# Patient Record
Sex: Female | Born: 1945 | Race: Black or African American | Hispanic: No | Smoking: Smoker, current status unknown
Health system: Southern US, Community
[De-identification: ages and names within clinical notes are randomized; demographics above are authoritative.]

## PROBLEM LIST (undated history)

## (undated) DIAGNOSIS — J449 Chronic obstructive pulmonary disease, unspecified: Secondary | ICD-10-CM

## (undated) DIAGNOSIS — M069 Rheumatoid arthritis, unspecified: Secondary | ICD-10-CM

## (undated) DIAGNOSIS — E119 Type 2 diabetes mellitus without complications: Secondary | ICD-10-CM

## (undated) DIAGNOSIS — G4730 Sleep apnea, unspecified: Secondary | ICD-10-CM

## (undated) DIAGNOSIS — I2510 Atherosclerotic heart disease of native coronary artery without angina pectoris: Secondary | ICD-10-CM

## (undated) DIAGNOSIS — F419 Anxiety disorder, unspecified: Secondary | ICD-10-CM

## (undated) DIAGNOSIS — F329 Major depressive disorder, single episode, unspecified: Secondary | ICD-10-CM

## (undated) DIAGNOSIS — I10 Essential (primary) hypertension: Secondary | ICD-10-CM

## (undated) DIAGNOSIS — F32A Depression, unspecified: Secondary | ICD-10-CM

## (undated) DIAGNOSIS — E7800 Pure hypercholesterolemia, unspecified: Secondary | ICD-10-CM

## (undated) HISTORY — DX: Depression, unspecified: F32.A

## (undated) HISTORY — DX: Chronic obstructive pulmonary disease, unspecified: J44.9

## (undated) HISTORY — DX: Major depressive disorder, single episode, unspecified: F32.9

## (undated) HISTORY — DX: Atherosclerotic heart disease of native coronary artery without angina pectoris: I25.10

## (undated) HISTORY — DX: Anxiety disorder, unspecified: F41.9

## (undated) HISTORY — PX: ABLATION: SHX5711

---

## 1998-03-24 ENCOUNTER — Emergency Department (HOSPITAL_COMMUNITY): Admission: EM | Admit: 1998-03-24 | Discharge: 1998-03-24 | Payer: Self-pay | Admitting: *Deleted

## 1998-07-14 ENCOUNTER — Emergency Department (HOSPITAL_COMMUNITY): Admission: EM | Admit: 1998-07-14 | Discharge: 1998-07-14 | Payer: Self-pay | Admitting: Emergency Medicine

## 1998-08-03 ENCOUNTER — Emergency Department (HOSPITAL_COMMUNITY): Admission: EM | Admit: 1998-08-03 | Discharge: 1998-08-03 | Payer: Self-pay | Admitting: Emergency Medicine

## 1998-08-11 ENCOUNTER — Ambulatory Visit (HOSPITAL_COMMUNITY): Admission: RE | Admit: 1998-08-11 | Discharge: 1998-08-11 | Payer: Self-pay | Admitting: Cardiology

## 1998-08-19 ENCOUNTER — Encounter: Payer: Self-pay | Admitting: Cardiology

## 1998-08-19 ENCOUNTER — Ambulatory Visit (HOSPITAL_COMMUNITY): Admission: RE | Admit: 1998-08-19 | Discharge: 1998-08-19 | Payer: Self-pay | Admitting: Cardiology

## 2000-01-18 ENCOUNTER — Encounter: Admission: RE | Admit: 2000-01-18 | Discharge: 2000-04-17 | Payer: Self-pay | Admitting: Internal Medicine

## 2000-08-10 ENCOUNTER — Encounter: Payer: Self-pay | Admitting: Internal Medicine

## 2000-08-10 ENCOUNTER — Ambulatory Visit (HOSPITAL_COMMUNITY): Admission: RE | Admit: 2000-08-10 | Discharge: 2000-08-10 | Payer: Self-pay | Admitting: Internal Medicine

## 2000-09-04 ENCOUNTER — Encounter: Payer: Self-pay | Admitting: Internal Medicine

## 2000-09-04 ENCOUNTER — Encounter: Admission: RE | Admit: 2000-09-04 | Discharge: 2000-09-04 | Payer: Self-pay | Admitting: Internal Medicine

## 2000-09-06 ENCOUNTER — Encounter: Payer: Self-pay | Admitting: Internal Medicine

## 2000-09-06 ENCOUNTER — Encounter: Admission: RE | Admit: 2000-09-06 | Discharge: 2000-09-06 | Payer: Self-pay | Admitting: Internal Medicine

## 2000-10-15 ENCOUNTER — Emergency Department (HOSPITAL_COMMUNITY): Admission: EM | Admit: 2000-10-15 | Discharge: 2000-10-15 | Payer: Self-pay | Admitting: Emergency Medicine

## 2000-10-15 ENCOUNTER — Encounter: Payer: Self-pay | Admitting: Emergency Medicine

## 2000-12-03 ENCOUNTER — Ambulatory Visit (HOSPITAL_COMMUNITY): Admission: RE | Admit: 2000-12-03 | Discharge: 2000-12-03 | Payer: Self-pay | Admitting: Cardiology

## 2001-01-09 ENCOUNTER — Emergency Department (HOSPITAL_COMMUNITY): Admission: EM | Admit: 2001-01-09 | Discharge: 2001-01-09 | Payer: Self-pay | Admitting: Emergency Medicine

## 2001-02-04 ENCOUNTER — Encounter: Admission: RE | Admit: 2001-02-04 | Discharge: 2001-05-05 | Payer: Self-pay | Admitting: Internal Medicine

## 2001-02-20 ENCOUNTER — Inpatient Hospital Stay (HOSPITAL_COMMUNITY): Admission: EM | Admit: 2001-02-20 | Discharge: 2001-02-20 | Payer: Self-pay | Admitting: Emergency Medicine

## 2001-02-21 ENCOUNTER — Ambulatory Visit (HOSPITAL_COMMUNITY): Admission: RE | Admit: 2001-02-21 | Discharge: 2001-02-22 | Payer: Self-pay | Admitting: Internal Medicine

## 2001-04-20 ENCOUNTER — Inpatient Hospital Stay (HOSPITAL_COMMUNITY): Admission: EM | Admit: 2001-04-20 | Discharge: 2001-04-23 | Payer: Self-pay | Admitting: Emergency Medicine

## 2001-04-21 ENCOUNTER — Encounter: Payer: Self-pay | Admitting: Internal Medicine

## 2001-07-10 ENCOUNTER — Encounter: Admission: RE | Admit: 2001-07-10 | Discharge: 2001-07-29 | Payer: Self-pay | Admitting: Internal Medicine

## 2001-10-10 ENCOUNTER — Encounter (HOSPITAL_BASED_OUTPATIENT_CLINIC_OR_DEPARTMENT_OTHER): Admission: RE | Admit: 2001-10-10 | Discharge: 2001-10-18 | Payer: Self-pay | Admitting: Internal Medicine

## 2002-01-13 ENCOUNTER — Encounter (HOSPITAL_BASED_OUTPATIENT_CLINIC_OR_DEPARTMENT_OTHER): Admission: RE | Admit: 2002-01-13 | Discharge: 2002-04-13 | Payer: Self-pay | Admitting: Internal Medicine

## 2002-05-29 ENCOUNTER — Inpatient Hospital Stay (HOSPITAL_COMMUNITY): Admission: AD | Admit: 2002-05-29 | Discharge: 2002-06-02 | Payer: Self-pay | Admitting: Internal Medicine

## 2002-05-29 ENCOUNTER — Encounter: Payer: Self-pay | Admitting: Internal Medicine

## 2002-12-22 ENCOUNTER — Encounter (HOSPITAL_BASED_OUTPATIENT_CLINIC_OR_DEPARTMENT_OTHER): Admission: RE | Admit: 2002-12-22 | Discharge: 2003-03-22 | Payer: Self-pay | Admitting: Internal Medicine

## 2003-09-09 ENCOUNTER — Encounter: Admission: RE | Admit: 2003-09-09 | Discharge: 2003-12-08 | Payer: Self-pay | Admitting: Internal Medicine

## 2003-09-18 ENCOUNTER — Emergency Department (HOSPITAL_COMMUNITY): Admission: EM | Admit: 2003-09-18 | Discharge: 2003-09-18 | Payer: Self-pay | Admitting: Emergency Medicine

## 2003-12-30 ENCOUNTER — Encounter (HOSPITAL_BASED_OUTPATIENT_CLINIC_OR_DEPARTMENT_OTHER): Admission: RE | Admit: 2003-12-30 | Discharge: 2004-01-15 | Payer: Self-pay | Admitting: Internal Medicine

## 2004-04-12 ENCOUNTER — Encounter (HOSPITAL_BASED_OUTPATIENT_CLINIC_OR_DEPARTMENT_OTHER): Admission: RE | Admit: 2004-04-12 | Discharge: 2004-07-11 | Payer: Self-pay | Admitting: Internal Medicine

## 2005-09-09 ENCOUNTER — Emergency Department (HOSPITAL_COMMUNITY): Admission: EM | Admit: 2005-09-09 | Discharge: 2005-09-10 | Payer: Self-pay | Admitting: Emergency Medicine

## 2006-08-16 ENCOUNTER — Emergency Department (HOSPITAL_COMMUNITY): Admission: EM | Admit: 2006-08-16 | Discharge: 2006-08-16 | Payer: Self-pay | Admitting: Emergency Medicine

## 2007-06-13 DIAGNOSIS — I2510 Atherosclerotic heart disease of native coronary artery without angina pectoris: Secondary | ICD-10-CM | POA: Insufficient documentation

## 2007-06-13 HISTORY — DX: Atherosclerotic heart disease of native coronary artery without angina pectoris: I25.10

## 2010-06-04 ENCOUNTER — Emergency Department (HOSPITAL_COMMUNITY)
Admission: EM | Admit: 2010-06-04 | Discharge: 2010-06-04 | Payer: Self-pay | Source: Home / Self Care | Admitting: Emergency Medicine

## 2010-06-06 ENCOUNTER — Inpatient Hospital Stay (HOSPITAL_COMMUNITY)
Admission: EM | Admit: 2010-06-06 | Discharge: 2010-06-08 | Payer: Self-pay | Source: Home / Self Care | Attending: Internal Medicine | Admitting: Internal Medicine

## 2010-07-09 NOTE — H&P (Signed)
Cynthia Fuller, DUTSON                 ACCOUNT NO.:  [id number]  MEDICAL RECORD NO.:  [id number]          PATIENT TYPE:  EMS  LOCATION:  ED                           FACILITY:  [facility]  PHYSICIAN:  Michiel Cowboy, MDDATE OF BIRTH:  Feb 08, 1946  DATE OF ADMISSION:  06/06/2010 DATE OF DISCHARGE:                             HISTORY & PHYSICAL   ATTENDING PHYSICIAN:  Therisa Doyne, M.D.  PRIMARY CARE PROVIDER:  None local.  CHIEF COMPLAINT:  Shortness of breath and wheezing.  Patient is a pleasant 65 year old female with past medical history significant for COPD.  [place] has been visiting here for 1 week.  Starting on Thursday, she developed wheezing and cough, no fevers.  She presented to ED, and was started on 40 mg of prednisone for presumed asthma exacerbation.  She continued to wheeze and develop worsening shortness of breath; at which point, she returned back to emergency department.  Also, her blood pressure was elevated to 160s, and that was concerning her, and she also wanted to have this evaluated.  In ED, she was found to continue to wheeze and now has developed a lingular pneumonia; at which point, triad hospitalist was called for admission.  Patient otherwise has not reported any fevers.  She has reported a cough and wheezing and shortness of breath.  Negative for any chest pain.  No nausea, no vomiting, no diarrhea.  She has history of rheumatoid arthritis and occasionally gets joint pains, especially in her hands and knees.  REVIEW OF SYSTEMS:  Negative, except for HPI.  PAST MEDICAL HISTORY: 1. Significant for hypertension. 2. Coronary artery disease status post cardiac catheterization in     2003, which was done here. 3. History of diabetes. 4. Hypertension and hyperlipidemia. 5. Rheumatoid arthritis with chronic pain. 6. Sleep apnea.  SOCIAL HISTORY:  Patient continues to smoke, but stopped smoking about  a week ago.  Does not drink or abuse drugs.  FAMILY HISTORY:  Significant for diabetes and will require renal transplant and also history of coronary artery disease and bypass. Mother with renal failure.  Father with pancreatic cancer.  ALLERGIES:  Neomycin.  MEDICATIONS: 1. Ipratropium as needed. 2. Albuterol inhaler and a squirt of Combivent as needed. 3. Vicodin 7.5/325 one tablet q. 4 hours p.r.n. pain. 4. Xanax 0.5 mg she uses b.i.d. as needed for anxiety. 5. Cymbalta 60 mg daily. 6. Lantus 35 units q.h.s. 7. Aspirin 81 mg daily. 8. Coreg 6.25 mg b.i.d. 9. Prednisone 40 mg daily, been started for the past 4 days. 10.Januvia 100 mg daily. 11.Pravastatin 80 mg daily. 12.Humalog regular insulin 5 units with meals. 13.Benazepril 20/12.5 mg with hydrochlorothiazide daily. 14.Isosorbide 30 mg daily. 15.Nitrostat as needed. 16.Patient uses CPAP at home while asleep.  VITALS:  Temperature 98.4, blood pressure 167/79, pulse 97, respirations 22, saturating 93% on room air.  Patient appears to be in no acute distress.  HEAD:  Nontraumatic.  Moist mucous membranes.  LUNGS: Significant wheezes bilaterally.  I could not appreciate much of any crackles.  Somewhat distant breath sounds.  HEART:  Regular rate and  rhythm.  I could not appreciate any murmurs, due to significant wheezing.  ABDOMEN:  Obese, but nontender, nondistended.  LOWER EXTREMITIES:  Without clubbing, cyanosis or edema.  Slightly obese. NEUROLOGICALLY:  Grossly intact.  Moving all 4 extremities.  LABORATORY STUDIES:  White blood cell count 10.9, hemoglobin 12, sodium 137, potassium 4.0, creatinine 0.74, calcium 9.3.  BNP 35.  Chest x-ray showing new lingular pneumonia, new from 2 days ago.  EKG showing normal sinus rhythm.  No evidence of ischemia.  ASSESSMENT/PLAN: 1. This is a 65 year old diabetic, who comes in with worsening     shortness of breath and wheezing, most likely secondary to     worsening chronic  obstructive pulmonary disease     exacerbation/pneumonia. 2. Chronic obstructive pulmonary disease exacerbation and pneumonia.     We will admit with aggressive pulmonary toilet, write for albuterol     p.r.n., Atrovent scheduled.  We will start her on Rocephin and     azithromycin for pneumonia.  We will write her for Advair.  Given     extensive wheezing, we will write for Solu-Medrol, this could be     weaned down rapidly, especially given pneumonia.  Write for tobacco     cessation.  Patient is interested in quitting. 3. History of diabetes.  We will put her on sliding scale.  Continue     her lisinopril. 4. History of coronary artery disease.  Given shortness of breath and     history of diabetes, we will cycle cardiac markers, although I am     pretty confident her shortness of breath most likely is secondary     to her chronic     obstructive pulmonary disease and pneumonia. 5. Prophylaxis.  Write for Protonix and sequential compression     devices.  CODE STATUS:  Patient wished to be full code.     Michiel Cowboy, MD     AVD/MEDQ  D:  06/06/2010  T:  06/06/2010  Job:  [id number]  Electronically Signed by Therisa Doyne MD on 07/09/2010 06:13:00 AM

## 2010-08-22 LAB — COMPREHENSIVE METABOLIC PANEL
ALT: 15 U/L (ref 0–35)
AST: 15 U/L (ref 0–37)
Albumin: 3.2 g/dL — ABNORMAL LOW (ref 3.5–5.2)
Chloride: 102 mEq/L (ref 96–112)
Creatinine, Ser: 0.7 mg/dL (ref 0.4–1.2)
GFR calc Af Amer: >60 mL/min (ref >60)
Potassium: 3.8 mEq/L (ref 3.5–5.1)
Sodium: 142 mEq/L (ref 135–145)
Total Bilirubin: 0.3 mg/dL (ref 0.3–1.2)

## 2010-08-22 LAB — CBC
HCT: 39.3 % (ref 36.0–46.0)
Hemoglobin: 12 g/dL (ref 12.0–15.0)
Hemoglobin: 13 g/dL (ref 12.0–15.0)
MCH: 30.3 pg (ref 26.0–34.0)
MCH: 30.5 pg (ref 26.0–34.0)
MCH: 30.7 pg (ref 26.0–34.0)
MCHC: 32.8 g/dL (ref 30.0–36.0)
MCHC: 33.1 g/dL (ref 30.0–36.0)
MCV: 93.1 fL (ref 78.0–100.0)
Platelets: 320 10*3/uL (ref 150–400)
RBC: 3.86 MIL/uL — ABNORMAL LOW (ref 3.87–5.11)
RBC: 3.93 MIL/uL (ref 3.87–5.11)
RBC: 4.23 MIL/uL (ref 3.87–5.11)
WBC: 11.9 10*3/uL — ABNORMAL HIGH (ref 4.0–10.5)

## 2010-08-22 LAB — BASIC METABOLIC PANEL
CO2: 28 mEq/L (ref 19–32)
CO2: 31 mEq/L (ref 19–32)
Calcium: 9 mg/dL (ref 8.4–10.5)
Chloride: 103 mEq/L (ref 96–112)
Chloride: 98 mEq/L (ref 96–112)
Creatinine, Ser: 0.74 mg/dL (ref 0.4–1.2)
GFR calc Af Amer: >60 mL/min (ref >60)
GFR calc Af Amer: >60 mL/min (ref >60)
Glucose, Bld: 193 mg/dL — ABNORMAL HIGH (ref 70–99)
Glucose, Bld: 235 mg/dL — ABNORMAL HIGH (ref 70–99)
Potassium: 3.9 mEq/L (ref 3.5–5.1)
Sodium: 137 mEq/L (ref 135–145)
Sodium: 140 mEq/L (ref 135–145)

## 2010-08-22 LAB — DIFFERENTIAL
Basophils Absolute: 0.1 10*3/uL (ref 0.0–0.1)
Basophils Relative: 1 % (ref 0–1)
Basophils Relative: 1 % (ref 0–1)
Eosinophils Absolute: 0 10*3/uL (ref 0.0–0.7)
Eosinophils Absolute: 0.2 10*3/uL (ref 0.0–0.7)
Eosinophils Relative: 0 % (ref 0–5)
Eosinophils Relative: 0 % (ref 0–5)
Lymphs Abs: 1.5 10*3/uL (ref 0.7–4.0)
Lymphs Abs: 1.6 10*3/uL (ref 0.7–4.0)
Lymphs Abs: 4 10*3/uL (ref 0.7–4.0)
Monocytes Absolute: 0.5 10*3/uL (ref 0.1–1.0)
Monocytes Absolute: 0.8 10*3/uL (ref 0.1–1.0)
Monocytes Relative: 11 % (ref 3–12)
Monocytes Relative: 13 % — ABNORMAL HIGH (ref 3–12)
Neutro Abs: 6.5 10*3/uL (ref 1.7–7.7)
Neutrophils Relative %: 55 % (ref 43–77)

## 2010-08-22 LAB — CARDIAC PANEL(CRET KIN+CKTOT+MB+TROPI)
CK, MB: 1.8 ng/mL (ref 0.3–4.0)
CK, MB: 2 ng/mL (ref 0.3–4.0)
Relative Index: INVALID (ref 0.0–2.5)
Total CK: 62 U/L (ref 7–177)
Total CK: 72 U/L (ref 7–177)

## 2010-08-22 LAB — GLUCOSE, CAPILLARY
Glucose-Capillary: 131 mg/dL — ABNORMAL HIGH (ref 70–99)
Glucose-Capillary: 145 mg/dL — ABNORMAL HIGH (ref 70–99)
Glucose-Capillary: 255 mg/dL — ABNORMAL HIGH (ref 70–99)
Glucose-Capillary: 334 mg/dL — ABNORMAL HIGH (ref 70–99)

## 2010-08-22 LAB — MAGNESIUM: Magnesium: 2.1 mg/dL (ref 1.5–2.5)

## 2010-08-22 LAB — PHOSPHORUS: Phosphorus: 4.1 mg/dL (ref 2.3–4.6)

## 2010-10-28 NOTE — H&P (Signed)
[place]  Patient:    Cynthia Fuller, HAKIM Visit Number: [id number] MRN: [id number]          Service Type: MED Location: [phone] Attending Physician:  Julieanne Manson Dictated by:   Tyson Dense, M.D. Admit Date:  04/20/2001                           History and Physical  CHIEF COMPLAINT:  Shortness of breath, chest pain, and cough.  HISTORY OF PRESENT ILLNESS:  A 65 year old female with a history of obesity and type 2 diabetes, recent ablation of SVT, and Wolff-Parkinson-White in September of 2002.  She was seen on Thursday, about three days ago, at the office of Armanda Magic, M.D., for questionable chest pain and congestion.  She did a chest x-ray, which was negative.  It was thought to be bronchitis and she was started on Zithromax.  The patient stated that over the last couple of days the symptoms have been getting worse with congestion, cough, more shortness of breath, and have a lot of chest pain.  She came to the ER with the above complaints.  Because of this history of SVT, an EKG was obtained which showed questionable T-wave changes.  In the ER, Colleen Can. Deborah Chalk, M.D., was called in for cardiology and he did not think it was a cardiac issue.  The CK and troponin were negative.  It was thought to be pleuritic pain.  The chest x-ray showed bilateral infiltrates.  Four days ago, the chest x-ray was negative.  It was thought that we had atelectasis versus infiltrates.  The patient reported that she had a fever of 101 degrees about three days ago as an outpatient, not much of a productive cough, and poor appetite.  She denies nausea, vomiting, and diarrhea.  No problems urinating. No headaches or dizziness.  No palpitations.  PAST MEDICAL HISTORY: 1. History of SVT and Wolff-Parkinson-White, status post ablation in September    of 2002. 2. Asthma. 3. Diabetes on oral agents. 4. Mitral valve prolapse. 5. Peripheral neuropathy of  the right foot. 6. Obesity. 7. History of bronchitis in the past. 8. History of tobacco abuse. 9. She also had a catheterization sometime in the past with nonobstructive    atherosclerotic disease and normal LV function.  PAST SURGICAL HISTORY: 1. Total abdominal hysterectomy in 1995. 2. In the 1960s she had a cyst from the spine removed. 3. In September of 2002, SVT ablation treatment.  MEDICATIONS: 1. Flovent p.r.n. 2. Albuterol p.r.n. 3. Glucophage 500 mg one tablet a day. 4. Altace 2.5 mg q.d. 5. Aspirin 81 mg q.d.  ALLERGIES:  None.  She is mildly intolerant to CODEINE.  SOCIAL HISTORY:  She smokes one pack a day for the last 20 years.  She is in the process of quitting.  Alcohol:  Occasional.  Drugs:  None.  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  Married.  Five children.  Her husband has prostate cancer.  FAMILY HISTORY:  Her father died of pancreatic cancer.  Her mother is in her 59s with diabetes and hypertension.  One brother with heart disease and a sister who is okay.  REVIEW OF SYSTEMS:  As above.  PHYSICAL EXAMINATION:  Temperature 99 degrees, blood pressure 160/87, pulse 100, respirations 26, 96% on room air.  GENERAL APPEARANCE:  A mildly distressed, obese female with some splinting.  HEENT:  Pupils equal, round, and reactive.  The oropharynx was clear.  NECK:  Supple.  No JVD. LUNGS:  Bibasilar crackles without any wheezing.  Some rhonchi.  HEART:  S1 and S2 without any murmurs.  ABDOMEN:  Soft.  Positive bowel sounds.  Nontender.  EXTREMITIES:  No edema.  NEUROLOGIC:  Nonfocal.  LABORATORY DATA:  White count 7.2, hemoglobin 13, platelets 301.  Chemistries: Sodium 137, potassium 5.2, BUN 4, creatinine 0.7, glucose 174.  LFTs normal. The UA was negative.  CK 180, troponin negative, MB negative.  PT and PTT normal.  The EKG showed sinus tachycardia with nonspecific T-wave changes and no change from prior.  Chest x-ray with  bilateral infiltrates with atelectasis.  The patient refused ABG.  IMPRESSION:  A 65 year old with diabetes, tobacco use, and asthma with chest x-ray showing questionable infiltrate versus atelectasis, fever, and congestion.  ASSESSMENT: 1. Pneumonia. 2. Asthma. 3. Diabetes.  PLAN:  Admit to telemetry.  IV antibiotics, Rocephin, and Zithromax. Nebulizer treatments.  Flovent MDI.  Pain control with morphine and Toradol. I will repeat a chest x-ray again in the morning.  For type 2 diabetes, sliding scale insulin and continue on Glucophage and Altace. Dictated by:   Tyson Dense, M.D. Attending Physician:  Julieanne Manson DD:  04/20/01 TD:  04/21/01 Job: [id number]

## 2010-10-28 NOTE — Cardiovascular Report (Signed)
[place]  Patient:    Cynthia Fuller, Cynthia Fuller                        MRN: [id number] Proc. Date: 12/03/00 Adm. Date:  [id number] Attending:  Armanda Magic CC:         Julieanne Manson, M.D.   Cardiac Catheterization  REFERRING PHYSICIAN:  Darius Bump, M.D.  PROCEDURES PERFORMED:  Left heart catheterization.  INDICATIONS:  Chest pain, abnormal Cardiolite.  COMPLICATIONS:  None.  INTRAVENOUS MEDICATIONS GIVEN:  IV Versed 2 mg.  INTRAVENOUS ACCESS:  Via right femoral artery 6 French sheath.  HISTORY OF PRESENT ILLNESS:  This is a very pleasant, 65 year old, black female, with a previous history reportedly of mitral valve prolapse, also of SVT.  She had recently had some complaints of chest pressure where she felt like someone was sitting on her chest.  A stress Cardiolite was done due to this problem which revealed an abnormal perfusion defect in the anterior wall but normal LV function.  The patient now presents for a catheterization.  DESCRIPTION OF PROCEDURE:  The patient is brought to the cardiac catheterization laboratory in a fasting, nonsedated state.  Informed consent was obtained.  The patient was connected to continuous heart rate and pulse oximetry monitoring, and intermittent blood pressure monitoring.  The right groin was prepped and draped in a sterile fashion.  Xylocaine 1% was used for local anesthesia.  Using the modified Seldinger technique, a 6 French sheath was placed in the right femoral artery.  Under fluoroscopic guidance, a 6 French Judkins JL4 catheter was placed in the left coronary artery.  Multiple cine films were taken in the 30 degree RAO, 40 degree LAO views.  This catheter was then exchanged out over a guide wire for 6 [place] JR4 catheter which was placed in the right coronary artery under fluoroscopic guidance. Multiple cine films were taken in a 30 degree RAO, 40 degree LAO views.  This catheter was then  exchanged out over a guide wire for a 6 French angled pigtail catheter which was placed in the left ventricular cavity.  Left ventriculography was performed in 30 degree RAO view using a total of 24 cc at 12 cc/sec.  The catheter was then pulled back across the aortic valve with no significant aortic valve gradient noted.  The catheter and sheaths were then removed and manual compressions performed until adequate hemostasis obtained. The patient was then transferred back to the room in stable condition.  RESULTS: 1. The left main coronary artery is widely patent throughout its course    until the distal segment at which there is a 30-40% eccentric plaque    before the bifurcation into the LAD and left circumflex system.  The    left main is heavily calcified. 2. The left anterior descending artery is calcified proximally and gives    rise to two diagonal branches which are widely patent.  The left anterior    descending artery is widely patent throughout its course. 3. Left circumflex gives rise to a very small obtuse marginal #1 branch which    is widely patent and then the rest of the left circumflex was widely patent    throughout its course. 4. Right coronary artery is widely patent throughout its course with diffuse    20-30% irregularities in the proximal and mid body.  It then bifurcates    into a posterior descending artery and posterolateral artery distally  which are widely patent.  LEFT VENTRICULOGRAM:  The left ventriculography performed in the 30 degree RAO view using a total of 24 cc of contrast at 12 cc/sec. showed normal LV function.  Left ventricular pressure was 125/25 mmHg.  Aortic pressure 154/81 mmHg.  ASSESSMENT: 1. Nonobstructive coronary disease. 2. Normal left ventricular function. 3. Noncardiac chest pain.  PLAN:  Discharge home later today.  Followup with Dr. Mayford Knife in 2-3 weeks. Continue current medications. DD:  12/03/00 TD:  12/03/00 Job:  [id number]

## 2010-10-28 NOTE — H&P (Signed)
Cynthia Fuller, Cynthia Fuller                           ACCOUNT NO.:  [id number]   MEDICAL RECORD NO.:  [id number]                   PATIENT TYPE:  INP   LOCATION:  0364                                 FACILITY:  [facility]   PHYSICIAN:  Thora Lance, M.D.               DATE OF BIRTH:  May 17, 1946   DATE OF ADMISSION:  05/29/2002  DATE OF DISCHARGE:                                HISTORY & PHYSICAL   HISTORY OF PRESENT ILLNESS:  The patient is a 65 year old black female with  a history of asthma, tobacco use, diabetes mellitus, hypertension, and mild  coronary artery disease who on 05/21/02, developed some cough which was  slightly productive and fever.  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  Her fever became more elevated as high as 103, and she  began having headache and myalgia.  She presented to the [facility] ER on 05/24/02, complaining of a fever as high as 103, shortness of  breath, and productive cough with dark brown sputum.  An initial white blood  cell count and chest x-ray were normal.  She was admitted with probable  pneumonia and placed on IV antibiotics.  Over the next 48 hours, she did not  improve, in fact developed worsening shortness of breath and wheezing.  A  chest x-ray was repeated and showed bilateral infiltrates in the upper  lobes.  The patient was started on IV steroids.  The patient's fever did  improve, and the patient reports that her sputum has become more clear.  The  patient was transferred here for further care.   PAST MEDICAL HISTORY:  1. Asthma, active tobacco use.  2. Diabetes mellitus x3.  3. Diabetes mellitus.  4. Hypertension.  5. Mild coronary artery disease.   PAST SURGICAL HISTORY:  1. Cardiac ablation secondary to arrythmias.  2. She has had a catheterization which apparently showed some mild coronary     blockages.  3.  Hysterectomy.  4. Spinal cyst removed.   ALLERGIES:  NEOMYCIN.   DISCHARGE MEDICATIONS:  1. Levaquin 500 mg IV q.24h.  2. IV Solu-Medrol 40 mg q.d.  3. Benicar/HCT 40/25 mg q.d.  4. Insulin Lantus 20 units subcutaneously q.d.  5. Tussionex one teaspoon q.12h.  6. Tessalon pearls 200 mg b.i.d.  7. Ambien 10 mg q.h.s. p.r.n.  8. Advair 250/50 one inhalation b.i.d.  9. Singular 10 mg q.d.  10.      Potassium 20 mEq b.i.d.  11.      Glucophage 500 mg q.d.  12.      Aspirin 81 mg q.d.  13.      Albuterol/Atrovent nebulizers q.6h.   FAMILY HISTORY:  Father died of pancreatic cancer.  Mother died of diabetic  complications.   SOCIAL HISTORY:  Self-employed and  clean houses.  She smokes about one pack  a day since a teenager.  Denies significant alcohol use.   REVIEW OF SYMPTOMS:  Otherwise negative.   PHYSICAL EXAMINATION:  GENERAL:  She appears comfortable laying at rest in  bed.  VITAL SIGNS:  Temperature 98.6, blood pressure 151/86, heart rate 75,  respirations 20, O2 saturation is 100% on 2 L nasal cannula.  HEENT:  Eyes:  Pupils equal, round, reactive to light.  Anicteric.  Ears:  Not done.  Oral cavity:  Oropharynx clear without exudate.  NECK:  No lymphadenopathy.  LUNGS:  Crackles at both bases, and mild decrease in breath sounds  bilaterally.  HEART:  Regular rate and rhythm without murmurs, rubs, or gallops.  ABDOMEN:  Soft, nontender, no mass or hepatosplenomegaly.  GYNECOLOGIC:  Deferred.  RECTAL:  Deferred.  EXTREMITIES:  No edema.   LABORATORY DATA:  Laboratories and chest x-ray are pending.   ASSESSMENT:  1. Bilateral pneumonia, possibly complication of influenza.  Appears to be     clinically improving.  2. Asthma/tobacco use.  3. Diabetes mellitus type 2.  4. Hypertension.  5. Coronary artery disease.   PLAN:  ,  1. Admit.  2. IV Tequin.  3. IV steroids.  4. Albuterol/Atrovent nebulizers.  5. Oxygen.  6. Singular.  7. Humibid LA.  8. Sliding  scale insulin and regular diabetic medications.  9. Check chest x-ray and laboratories.                                               Thora Lance, M.D.    Delorse Limber  D:  05/29/2002  T:  05/29/2002  Job:  [id number]

## 2010-10-28 NOTE — Consult Note (Signed)
[facility]  Patient:    Cynthia Fuller, Cynthia Fuller Visit Number: [id number] MRN: [id number]          Service Type: FTC Location: FOOT Attending Physician:  Sharren Bridge Dictated by:   Jonelle Sports Cheryll Cockayne, M.D. Proc. Date: 07/10/01 Admit Date:  07/10/2001   CC:         Julieanne Manson, M.D., [facility]   Consultation Report  HISTORY OF PRESENT ILLNESS:  This 65 year old black female is referred for foot evaluation and advice in connection with type 2 diabetes.  The patient has had type 2 diabetes for the past 2 years and is currently on treatment with glucophage apparently with good effect, although specific A1C levels are not known. She has recently voluntarily lost 13 pounds, is working at ceasing smoking which had been a 20-year habit, and otherwise seems to have taken a good interest in her health.  She apparently was placed on verapamil for cardiac problems some year or so ago, and at that time her feet swelled significantly, and she began to experience dysesthesias of the feet. With the cessation of verapamil, the edema resolved, but the other symptoms have persisted. They have been generally symmetrical and are characterized as numbness, coldness (when the skin is not ostensibly cold) and some tingling. There is no significant pain. The symptoms are not particularly aggravated at night, although her feet do feel cold at night. She was apparently at one point given some Neurontin, but she is unaware of the doses and noted some drowsiness which she retrospectively may attribute to other medications but nonetheless stopped the Neurontin. She is here now for our evaluation and advice.  PAST MEDICAL HISTORY:  Is noteworthy for depression, elevated cholesterol, Wolfe-Parkinson-White syndrome with mitral valve prolapse, and a recent hospitalization for pneumonia in November 2002.  PAST SURGICAL HISTORY:  Total abdominal hysterectomy,  some cyst from the spine, and also an electroablation in conjunction with her WPW syndrome.  ALLERGIES:  CODEINE.  REGULAR MEDICATIONS: 1. Altace 2.5 mg per day. 2. Glucophage 500 mg per day. 3. Aspirin 81 mg per day. 4. Motrin p.r.n. 5. Flovent. 6. Albuterol inhalers p.r.n.  PHYSICAL EXAMINATION:  Examination today is limited to the distal lower extremities. The feet are without gross deformity, although there is some slight flattening of the longitudinal metatarsal arch bilaterally. There is some shortening of the heel cords bilaterally. Skin temperatures are normal and symmetrical. All pulses are palpable and adequate. Monofilament testing shows the preservation of protective sensations throughout. There is slight callus formation on both halluces in first MP joint areas and at the heels bilaterally.  DISPOSITION: 1. The patient was given general instruction regarding foot care and    diabetes with nurse and physician reinforcement. Particularly we    reinforced the importance for good diabetes control and the importance    for cessation of smoking. We congratulated her on the weight loss and    encouraged her to continue in that direction. 2. The nails were cared for in detail by the nurse, and no significant    problems were encountered. The calluses on the halluces bilaterally,    on the plantar aspects of the first MP joints, and on the plantar    and lateral aspects of the heels were dremeled in those 6 locations    again without incident. 3. It was recommended to the patient that she might try evening Primrose    oil in the usual recommended doses to see if this  would alleviate her    symptoms. If not after a reasonable period of time, it was suggested    that she add back the Neurontin beginning with the single tablet 100 mg    per day and gradually advancing her dose up to as high as 800 mg per    day if needed. 4. Follow-up visit will be here in 3 months for nail  care and general    reevaluation. Dictated by:   Jonelle Sports Cheryll Cockayne, M.D. Attending Physician:  Sharren Bridge DD:  07/10/01 TD:  07/10/01 Job: [id number]

## 2010-10-28 NOTE — Discharge Summary (Signed)
[place]. [facility]  Patient:    Cynthia Fuller, Cynthia Fuller Visit Number: [id number] MRN: [id number]          Service Type: DSU Location: [phone] Attending Physician:  Lewayne Bunting Dictated by:   Chinita Pester, C.R.N.P. Admit Date:  02/21/2001 Discharge Date: 02/22/2001   CC:         Dr. [place] [facility]  Markus Daft, M.D., [facility]  Rudene Christians. Ladona Ridgel, M.D. [facility]   Discharge Summary  PRIMARY DIAGNOSIS:  Supraventricular tachycardia.  HISTORY OF PRESENT ILLNESS:  This is a 65 year old female with a history of SVT admitted with incessant SVT, now controlled on calcium blockers.  She was in her usual state of health until the morning of admission when she was awakened by palpitations which persisted until she presented to the emergency room where she had incessant SVT, which immediately returned after adenosine and finally stopped with IV Cardizem.  The patient signed out AMA that evening, but returned the following day for a radiofrequency catheter ablation.  PAST MEDICAL HISTORY:  Positive for SVT, MVP, diabetes, peripheral neuropathy of her right foot.  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  She the patient tolerated the procedure well and had no postoperative complications.  Because of elevated blood pressure, she was started on Altace 2.5 mg daily, and instructed to have a BMET done within 10 days.  DISCHARGE MEDICATIONS:  Other medications include: 1. Coated aspirin 81 mg daily for 6 weeks. 2. Atenolol 50 daily for 5 days, then 25 for 5 days, then stop. 3. Glucophage 500 mg daily. 4. Altace 2.5 daily. 5. Antibiotics prior to any dental, gynecologic, or bowel work for the    next 3 months.  She was to have a BMET within 10 days, results to Dr. Delrae Alfred.  She is to follow with Dr. Ladona Ridgel on October 8, at 4:15 in the afternoon, in  the [facility] office and also continue followed with Dr. Mayford Knife and Dr. Delrae Alfred. ictated by:   Chinita Pester, C.R.N.P. Attending Physician:  Lewayne Bunting DD:  02/22/01 TD:  02/22/01 Job: [id number]

## 2010-10-28 NOTE — Discharge Summary (Signed)
NAMEALBANA, Cynthia Fuller                           ACCOUNT NO.:  [id number]   MEDICAL RECORD NO.:  [id number]                   PATIENT TYPE:  INP   LOCATION:  0364                                 FACILITY:  [facility]   PHYSICIAN:  Darius Bump, M.D.             DATE OF BIRTH:  Jul 23, 1945   DATE OF ADMISSION:  05/29/2002  DATE OF DISCHARGE:  06/02/2002                                 DISCHARGE SUMMARY   ADMISSION DIAGNOSES:  1. Bilateral pneumonia, possibly complication of influenza.  2. Asthma/chronic obstructive pulmonary disease.  3. Type 2 diabetes mellitus.  4. Hypertension.  5. Coronary artery disease.   DISCHARGE DIAGNOSES:  1. Bilateral pneumonia, possibly complication of influenza.  2. Asthma/chronic obstructive pulmonary disease.  3. Type 2 diabetes mellitus.  4. Hypertension.  5. Coronary artery disease.   DISCHARGE MEDICATIONS:  1. Tequin 400 mg daily for three days.  2. Glucophage 500 one twice daily.  3. Altace 5 mg daily.  4. HCTZ 12.5 mg daily.  5. Prednisone 20 two p.o. daily x2 days, then one p.o. daily x3 days, then     one half p.o. daily x3 days.  6. ____________100/50 one inhalation twice daily.  7. Albuterol and Atrovent nebulizers every six hours as needed.  8. Aspirin 81 mg daily.  9. Glyburide 2.5 mg daily.   BRIEF HISTORY AND PHYSICAL:  The patient is a 65 year old woman who was  admitted to [facility] on May 29, 2002 as a transfer, at  [facility], where she had been admitted on May 24, 2002.  She was transferred per desire of her family and due to continuing  symptoms despite treatment in the hospital.  VITAL SIGNS:  On admission she was afebrile.  Her blood pressure was  slightly elevated at 151/86.  Heart rate was 75.  Her O2 saturation was 100%  on 2 L.  HEENT:  Unremarkable.  NECK:  Unremarkable.  LUNGS:  Showed crackles at both bases with mildly decreased breath sounds.  HEART:  Regular rate and  rhythm.  ABDOMEN:  Soft and nontender.  EXTREMITIES:  Without edema.   HOSPITAL COURSE:  1. PNEUMONIA:  For her pneumonia she remained on IV fluoroquinolones.  She     was switched to Tequin.  She remained on this medication until the time     of discharge and would be discharged in two days of therapy to complete a     10 day course.  Her fever defervesced and her pulmonary status in terms     of shortness of breath and cough improved.   1. HYPERTENSION:  On admission her blood pressure was mildly elevated.  She     was on Benicar HCT at the time of transfer.  Her preadmission medication     included Altace and she had recently been given a prescription for HCTZ     by  Dr. Delrae Alfred prior to admission.  She was discharged to home on Altace     and HCTZ.   1. ASTHMA EXACERBATION/COPD:  Home nebulizers were arranged.  She was     maintained on steroids throughout hospitalization and discharged on     prednisone taper.   1. DIABETES MELLITUS:  CBGs remained slightly elevated at the time of     discharge, probably in the context of the steroids that she had been     receiving throughout the hospitalization.  Glucophage was increased to     500 b.i.d. and she was placed on low dose glyburide.  She was instructed     to call the office within two days of discharge with the note of her     fasting blood sugar and for follow-up, she was to schedule an appointment     to see Dr. Delrae Alfred within two weeks of discharge and call Tuesday,     December 23 or Wednesday, December 24, with her fasting blood sugars.   CONDITION ON DISCHARGE:  Improved.   LABORATORY DATA:  Laboratory on admission:  Her white count was 14.2,  hemoglobin was 12.5, platelet count was adequate at 318.  At time of  discharge her white count had dropped to 10.1.  Sodium remained essentially  normal throughout hospitalization and glucoses were as high as 345 at the  time of admission, but were improved by time of  discharge.  Renal function  remained stable.  Albumin was low at 2.6.  At one point BNP was checked due  to concern about her possibly having congestive heart failure along with her  COPD, but her BNP level was normal at 50.   Laboratory and chest x-ray done on admission showed mild predominantly  perihilar interstitial and paroxysmal edema, and it was thought to be due to  her ongoing pneumonia.                                                Darius Bump, M.D.    MJM/MEDQ  D:  06/22/2002  T:  06/22/2002  Job:  [id number]

## 2010-10-28 NOTE — Op Note (Signed)
[place]. [facility]  Patient:    Cynthia Fuller, Cynthia Fuller Visit Number: [id number] MRN: [id number]          Service Type: DSU Location: [facility] [id number] Attending Physician:  Lewayne Bunting Dictated by:   Doylene Canning. Ladona Ridgel, M.D. [facility] Proc. Date: 02/21/01 Admit Date:  02/21/2001   CC:         Philis Nettle, N.P.  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  INDICATION:  The patient is a very pleasant 65 year old woman with a long history of tachypalpitations which had been refractory to medical therapy. She was seen in the hospital yesterday with refractory SVT and eventually this required intravenous Cardizem.  She left the hospital against medical advised but returned today for electrophysiology study and catheter ablation of her SVT.  DESCRIPTION OF PROCEDURE:  After informed consent was obtained, the patient was taken to the diagnostic EP lab in the fasted state.  After usual preparation and draping, intravenous Fentanyl and midazolam was given for sedation.  A 6 [place] exploit catheter was inserted percutaneous in the right jugular vein and advanced to the coronary sinus.  A 5 French quadripolar catheter was inserted percutaneously in the right femoral vein and advanced to the RV apex.  A French quadripolar catheter was inserted percutaneously in the right femoral vein and advanced to the his-bundle region.  After measurement of basic intervals, rapid ventricular pacing was carried out from the RV apex and stepwise decreased down to 350 msec where VA Wenckebach was observed.  During rapid ventricular pacing, the AV electrical pacing sequence was midline and decremental.  Next, programmed ventricular stimulation was carried out from the RV apex at a based drive cycle length of 664 msec.  The S1 and S2 interval was stepwise decreased down to 320 msec where the  retrograde AV node ERP was observed. Once again during programmed ventricular stimulation, the atrial activation sequence was midline and decremental.  Rapid atrial pacing was carried out from the coronary sinuses at a pacing cycle length of 490 msec and stepwise decreased down to 430 msec where AV Wenckebach was observed.  During rapid atrial pacing, the PR interval became equal to but not greater than the RA interval.  Next, programmed ventricle stimulation was carried out from the coronary sinus at a base drive cycle length of 403 msec.  The S1 and S2 interval was stepwise decreased down to 270 msec where the ERP of the AV node was observed.  During programmed atrial stimulation, there were multiple AH jumps and echo beats but no initial SVT.  Isoproterenol was subsequently infused at 30 cc per hour (2 mcg per minute) and additional programmed atrial stimulation was carried out from the coronary sinus at a base drive cycle length of 474 msec.  The S1 and S2 interval was stepwise decreased down to 360 msec where AV node re-entry tachycardia was initiated.  During tachycardia, the VA interval was quite short and the PVCs placed on the time of his-bundle refractory demonstrated no atrial pre-excitation.  The cycle length of the tachycardia varied between 370 and 385 msec.  Ventricular pacing at 20 msec faster than the tachycardia cycle length demonstrated a VAV conduction sequence.  With confirmation of AV nodal re-entry tachycardia in hand, the ablation catheter was subsequently maneuvered into the probed triangle region between sites six through nine in Calot triangle.  Nine RF energy applications including two bone  RF energy applications were subsequently delivered in Calot triangle.  During RF energy application, there was accelerated junctional rhythm.  Following RF energy application, additional pacing was subsequently carried out with no inducible SVT and no evidence of any  residual dual AV nodal physiology.  The patient was observed for 30 minutes and there was no additional evidence of SVT.  Catheters were removed.  Hemostasis assured and the patient returned to her room in satisfactory condition.  COMPLICATIONS:  There were no immediate procedure complications.  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  The tachycardia was rendered noninducible and there was no evidence of any residual dual AV nodal physiology. Dictated by:   Doylene Canning. Ladona Ridgel, M.D. [facility] Attending Physician:  Lewayne Bunting DD:  02/21/01 TD:  02/21/01 Job: [id number]

## 2010-10-28 NOTE — Discharge Summary (Signed)
[place]. [facility]  Patient:    Cynthia Fuller, Cynthia Fuller Visit Number: [id number] MRN: [id number]          Service Type: MED Location: [id number] [phone] Attending Physician:  Julieanne Manson Dictated by:   Felizardo Hoffmann, M.D. Admit Date:  04/20/2001 Discharge Date: 04/23/2001                             Discharge Summary  DATE OF BIRTH: Oct 28, 1944  ADMISSION DIAGNOSES: 1. Pneumonia. 2. Asthma exacerbation. 3. Diabetes mellitus. 4. Hypertension. 5. History of supraventricular tachycardia, status post ablation.  DISCHARGE DIAGNOSES: 1. Pneumonia. 2. Asthma exacerbation. 3. Diabetes mellitus. 4. Hypertension. 5. History of supraventricular tachycardia, status post ablation.  HISTORY OF PRESENT ILLNESS:  The patient is a 65 year old female with a history of diabetes mellitus, obesity, asthma, and SVT status post ablation two months ago, who presented to Armanda Magic, M.D., her cardiologist, for follow-up two days prior to admission with fever and cough and shortness of breath.  Apparently a chest x-ray did not show any abnormalities at that time and she was initiated on Zithromax.  The patient, however, continued with increasing shortness of breath and presented to the emergency room on April 20, 2001, and was admitted for pneumonia.  The patient also apparently had some anterior chest discomfort that was felt to be noncardiac after evaluation by Dr. Deborah Chalk of cardiology.  Chest x-ray showed bilateral lower lobe infiltrates.  White count on admission was 7.2, hemoglobin 13, electrolytes were essentially normal.  Sugars were running in the high 100s.  HOSPITAL COURSE:  The patient was admitted and placed on IV Azithromycin and ceftriaxone and oxygen.  She was maintained on her usual medications otherwise. The patient has progressed nicely over the last several days since her admission and is currently saturating in the low 90s on room air even when up  and moving about and feels that her breathing is improving.  She has also been afebrile now for 48 hours.  She is anxious to go home today.  With regards to her hypertension, that has been well controlled on low dose Altace.  The patient also had anterior chest discomfort.  This has been diagnosed as chest wall pain and has been managed well with nonsteroidal anti-inflammatory agents.  She is feeling much better today since those were started yesterday. Encouraged her at discharge to take ibuprofen with food and to discontinue its use if she develops epigastric discomfort and not take it more than a weeks length of time.  Her diabetes has been fairly well controlled with sugars in the low to mid-100s on her Glucophage and Insulin sliding scale.  With her history of SVT, she has not had any concerning arrhythmias during the hospitalization.  The patient is thus discharged in stable condition with the following medications.  DISCHARGE MEDICATIONS: 1. Safixine 400 mg daily for seven more days. 2. Zithromax finish her dose and a half that are left at home 500 today and    250 the following day. 3. Aspirin 81 mg daily. 4. Altace 2.5 mg daily. 5. Glucophage 500 mg daily with meals. 6. Flovent two puffs twice daily. 7. Albuterol two puffs four times daily. 8. Ibuprofen 400 to 600 mg every six hours with food as needed for chest    discomfort.  DIET:  Low cholesterol, 1800 ADA diet.  FOLLOW-UP:  With Dr. Delrae Alfred in one weeks time and to call  for an appointment.  Also to call if she develops any worsening shortness of breath, cough, or fever. Dictated by:   Felizardo Hoffmann, M.D. Attending Physician:  Julieanne Manson DD:  04/22/01 TD:  04/23/01 Job: [id number]

## 2014-06-30 DIAGNOSIS — F17200 Nicotine dependence, unspecified, uncomplicated: Secondary | ICD-10-CM | POA: Insufficient documentation

## 2015-10-05 DIAGNOSIS — J029 Acute pharyngitis, unspecified: Secondary | ICD-10-CM | POA: Insufficient documentation

## 2015-10-21 ENCOUNTER — Inpatient Hospital Stay (HOSPITAL_COMMUNITY)
Admission: EM | Admit: 2015-10-21 | Discharge: 2015-10-25 | DRG: 948 | Disposition: A | Discharge status: Skilled Nursing Facility | Payer: Medicare Other | Attending: Internal Medicine | Admitting: Internal Medicine

## 2015-10-21 ENCOUNTER — Emergency Department (HOSPITAL_COMMUNITY): Payer: Medicare Other

## 2015-10-21 ENCOUNTER — Encounter (HOSPITAL_COMMUNITY): Payer: Self-pay | Admitting: Emergency Medicine

## 2015-10-21 ENCOUNTER — Inpatient Hospital Stay (HOSPITAL_COMMUNITY): Payer: Medicare Other

## 2015-10-21 DIAGNOSIS — Z883 Allergy status to other anti-infective agents status: Secondary | ICD-10-CM | POA: Diagnosis not present

## 2015-10-21 DIAGNOSIS — R269 Unspecified abnormalities of gait and mobility: Secondary | ICD-10-CM | POA: Insufficient documentation

## 2015-10-21 DIAGNOSIS — Z833 Family history of diabetes mellitus: Secondary | ICD-10-CM | POA: Diagnosis not present

## 2015-10-21 DIAGNOSIS — Z7982 Long term (current) use of aspirin: Secondary | ICD-10-CM | POA: Diagnosis not present

## 2015-10-21 DIAGNOSIS — F17210 Nicotine dependence, cigarettes, uncomplicated: Secondary | ICD-10-CM | POA: Diagnosis present

## 2015-10-21 DIAGNOSIS — G4733 Obstructive sleep apnea (adult) (pediatric): Secondary | ICD-10-CM | POA: Diagnosis present

## 2015-10-21 DIAGNOSIS — Z7952 Long term (current) use of systemic steroids: Secondary | ICD-10-CM | POA: Diagnosis not present

## 2015-10-21 DIAGNOSIS — I10 Essential (primary) hypertension: Secondary | ICD-10-CM | POA: Diagnosis not present

## 2015-10-21 DIAGNOSIS — R32 Unspecified urinary incontinence: Secondary | ICD-10-CM | POA: Diagnosis present

## 2015-10-21 DIAGNOSIS — E119 Type 2 diabetes mellitus without complications: Secondary | ICD-10-CM | POA: Diagnosis not present

## 2015-10-21 DIAGNOSIS — E1165 Type 2 diabetes mellitus with hyperglycemia: Secondary | ICD-10-CM | POA: Diagnosis present

## 2015-10-21 DIAGNOSIS — E7800 Pure hypercholesterolemia, unspecified: Secondary | ICD-10-CM | POA: Diagnosis present

## 2015-10-21 DIAGNOSIS — W1830XA Fall on same level, unspecified, initial encounter: Secondary | ICD-10-CM | POA: Diagnosis present

## 2015-10-21 DIAGNOSIS — E785 Hyperlipidemia, unspecified: Secondary | ICD-10-CM | POA: Diagnosis present

## 2015-10-21 DIAGNOSIS — Z79899 Other long term (current) drug therapy: Secondary | ICD-10-CM

## 2015-10-21 DIAGNOSIS — Z794 Long term (current) use of insulin: Secondary | ICD-10-CM | POA: Diagnosis not present

## 2015-10-21 DIAGNOSIS — R531 Weakness: Secondary | ICD-10-CM | POA: Diagnosis present

## 2015-10-21 DIAGNOSIS — M069 Rheumatoid arthritis, unspecified: Secondary | ICD-10-CM | POA: Diagnosis present

## 2015-10-21 DIAGNOSIS — F17200 Nicotine dependence, unspecified, uncomplicated: Secondary | ICD-10-CM | POA: Insufficient documentation

## 2015-10-21 DIAGNOSIS — R29898 Other symptoms and signs involving the musculoskeletal system: Secondary | ICD-10-CM | POA: Diagnosis present

## 2015-10-21 DIAGNOSIS — R55 Syncope and collapse: Secondary | ICD-10-CM | POA: Diagnosis present

## 2015-10-21 HISTORY — DX: Essential (primary) hypertension: I10

## 2015-10-21 HISTORY — DX: Sleep apnea, unspecified: G47.30

## 2015-10-21 HISTORY — DX: Pure hypercholesterolemia, unspecified: E78.00

## 2015-10-21 HISTORY — DX: Type 2 diabetes mellitus without complications: E11.9

## 2015-10-21 HISTORY — DX: Rheumatoid arthritis, unspecified: M06.9

## 2015-10-21 LAB — URINALYSIS, ROUTINE W REFLEX MICROSCOPIC
Bilirubin Urine: NEGATIVE
Glucose, UA: 100 mg/dL — AB
Hgb urine dipstick: NEGATIVE
KETONES UR: NEGATIVE mg/dL
LEUKOCYTES UA: NEGATIVE
NITRITE: NEGATIVE
PROTEIN: 100 mg/dL — AB
Specific Gravity, Urine: 1.011 (ref 1.005–1.030)
pH: 7.5 (ref 5.0–8.0)

## 2015-10-21 LAB — COMPREHENSIVE METABOLIC PANEL
ALBUMIN: 4 g/dL (ref 3.5–5.0)
ALK PHOS: 48 U/L (ref 38–126)
ALT: 16 U/L (ref 14–54)
AST: 19 U/L (ref 15–41)
Anion gap: 13 (ref 5–15)
BUN: 16 mg/dL (ref 6–20)
CO2: 30 mmol/L (ref 22–32)
Calcium: 9.9 mg/dL (ref 8.9–10.3)
Chloride: 100 mmol/L — ABNORMAL LOW (ref 101–111)
Creatinine, Ser: 0.82 mg/dL (ref 0.44–1.00)
GFR calc Af Amer: >60 mL/min (ref >60)
GFR calc non Af Amer: >60 mL/min (ref >60)
Glucose, Bld: 261 mg/dL — ABNORMAL HIGH (ref 65–99)
Potassium: 3.7 mmol/L (ref 3.5–5.1)
SODIUM: 143 mmol/L (ref 135–145)
Total Bilirubin: 0.6 mg/dL (ref 0.3–1.2)
Total Protein: 8.2 g/dL — ABNORMAL HIGH (ref 6.5–8.1)

## 2015-10-21 LAB — VITAMIN B12: VITAMIN B 12: 588 pg/mL (ref 180–914)

## 2015-10-21 LAB — CBC WITH DIFFERENTIAL/PLATELET
BASOS PCT: 0 %
Basophils Absolute: 0 10*3/uL (ref 0.0–0.1)
EOS ABS: 0 10*3/uL (ref 0.0–0.7)
Eosinophils Relative: 0 %
HCT: 39 % (ref 36.0–46.0)
HEMOGLOBIN: 12.8 g/dL (ref 12.0–15.0)
Lymphocytes Relative: 21 %
Lymphs Abs: 2 10*3/uL (ref 0.7–4.0)
MCH: 30 pg (ref 26.0–34.0)
MCHC: 32.8 g/dL (ref 30.0–36.0)
MCV: 91.5 fL (ref 78.0–100.0)
Monocytes Absolute: 0.4 10*3/uL (ref 0.1–1.0)
Monocytes Relative: 4 %
NEUTROS ABS: 7.4 10*3/uL (ref 1.7–7.7)
NEUTROS PCT: 75 %
Platelets: 315 10*3/uL (ref 150–400)
RBC: 4.26 MIL/uL (ref 3.87–5.11)
RDW: 15.6 % — ABNORMAL HIGH (ref 11.5–15.5)
WBC: 9.8 10*3/uL (ref 4.0–10.5)

## 2015-10-21 LAB — URINE MICROSCOPIC-ADD ON

## 2015-10-21 LAB — CBG MONITORING, ED
Glucose-Capillary: 230 mg/dL — ABNORMAL HIGH (ref 65–99)
Glucose-Capillary: 231 mg/dL — ABNORMAL HIGH (ref 65–99)

## 2015-10-21 LAB — GLUCOSE, CAPILLARY: GLUCOSE-CAPILLARY: 267 mg/dL — AB (ref 65–99)

## 2015-10-21 LAB — CK: CK TOTAL: 260 U/L — AB (ref 38–234)

## 2015-10-21 LAB — I-STAT TROPONIN, ED: TROPONIN I, POC: 0.02 ng/mL (ref 0.00–0.08)

## 2015-10-21 LAB — LIPASE, BLOOD: Lipase: 30 U/L (ref 11–51)

## 2015-10-21 MED ORDER — BENAZEPRIL-HYDROCHLOROTHIAZIDE 20-12.5 MG PO TABS: 1.0000 | ORAL_TABLET | Freq: Every day | ORAL | Status: DC

## 2015-10-21 MED ORDER — SODIUM CHLORIDE 0.9% FLUSH
3.0000 mL | Freq: Two times a day (BID) | INTRAVENOUS | Status: DC
  Administered 2015-10-22 – 2015-10-24 (×5): 3 mL via INTRAVENOUS

## 2015-10-21 MED ORDER — ALPRAZOLAM 0.5 MG PO TABS
0.5000 mg | ORAL_TABLET | Freq: Three times a day (TID) | ORAL | Status: DC | PRN
Start: 2015-10-21 — End: 2015-10-25
  Administered 2015-10-21 – 2015-10-25 (×10): 0.5 mg via ORAL
  Filled 2015-10-21 (×10): qty 1

## 2015-10-21 MED ORDER — INSULIN ASPART 100 UNIT/ML ~~LOC~~ SOLN
0.0000 [IU] | Freq: Three times a day (TID) | SUBCUTANEOUS | Status: DC
  Administered 2015-10-22: 5 [IU] via SUBCUTANEOUS
  Administered 2015-10-22: 2 [IU] via SUBCUTANEOUS
  Administered 2015-10-22 – 2015-10-23 (×2): 3 [IU] via SUBCUTANEOUS
  Administered 2015-10-23: 1 [IU] via SUBCUTANEOUS

## 2015-10-21 MED ORDER — FLUTICASONE PROPIONATE 50 MCG/ACT NA SUSP
1.0000 | Freq: Every day | NASAL | Status: DC
  Administered 2015-10-21: 1 via NASAL

## 2015-10-21 MED ORDER — LORAZEPAM 2 MG/ML IJ SOLN: 1.0000 mg | Freq: Once | INTRAMUSCULAR | Status: DC | PRN

## 2015-10-21 MED ORDER — BENAZEPRIL HCL 20 MG PO TABS
20.0000 mg | ORAL_TABLET | Freq: Every day | ORAL | Status: DC
  Administered 2015-10-21 – 2015-10-25 (×5): 20 mg via ORAL
  Filled 2015-10-21 (×5): qty 1

## 2015-10-21 MED ORDER — INSULIN GLARGINE 100 UNIT/ML ~~LOC~~ SOLN
20.0000 [IU] | Freq: Every day | SUBCUTANEOUS | Status: DC
  Administered 2015-10-21 – 2015-10-22 (×2): 20 [IU] via SUBCUTANEOUS
  Filled 2015-10-21 (×3): qty 0.2

## 2015-10-21 MED ORDER — ASPIRIN EC 81 MG PO TBEC
81.0000 mg | DELAYED_RELEASE_TABLET | Freq: Every day | ORAL | Status: DC
  Administered 2015-10-21 – 2015-10-25 (×5): 81 mg via ORAL
  Filled 2015-10-21 (×5): qty 1

## 2015-10-21 MED ORDER — MORPHINE SULFATE (PF) 2 MG/ML IV SOLN: 2.0000 mg | Freq: Once | INTRAVENOUS | Status: DC

## 2015-10-21 MED ORDER — MORPHINE SULFATE (PF) 4 MG/ML IV SOLN
4.0000 mg | Freq: Once | INTRAVENOUS | Status: AC
  Administered 2015-10-21: 4 mg via INTRAVENOUS
  Filled 2015-10-21: qty 1

## 2015-10-21 MED ORDER — IBUPROFEN 600 MG PO TABS
600.0000 mg | ORAL_TABLET | Freq: Four times a day (QID) | ORAL | Status: DC | PRN
  Administered 2015-10-21 – 2015-10-24 (×7): 600 mg via ORAL
  Filled 2015-10-21: qty 3
  Filled 2015-10-21: qty 1
  Filled 2015-10-21: qty 3
  Filled 2015-10-21 (×4): qty 1

## 2015-10-21 MED ORDER — LORAZEPAM 1 MG PO TABS
1.0000 mg | ORAL_TABLET | Freq: Once | ORAL | Status: AC
  Administered 2015-10-21: 1 mg via ORAL
  Filled 2015-10-21: qty 1

## 2015-10-21 MED ORDER — ENOXAPARIN SODIUM 40 MG/0.4ML ~~LOC~~ SOLN
40.0000 mg | SUBCUTANEOUS | Status: DC
  Administered 2015-10-21 – 2015-10-24 (×4): 40 mg via SUBCUTANEOUS
  Filled 2015-10-21 (×4): qty 0.4

## 2015-10-21 MED ORDER — HYDROCHLOROTHIAZIDE 12.5 MG PO CAPS
12.5000 mg | ORAL_CAPSULE | Freq: Every day | ORAL | Status: DC
  Administered 2015-10-21 – 2015-10-25 (×5): 12.5 mg via ORAL
  Filled 2015-10-21 (×5): qty 1

## 2015-10-21 MED ORDER — FENTANYL CITRATE (PF) 100 MCG/2ML IJ SOLN
100.0000 ug | Freq: Once | INTRAMUSCULAR | Status: AC
  Administered 2015-10-21: 100 ug via INTRAVENOUS
  Filled 2015-10-21: qty 2

## 2015-10-21 MED ORDER — GADOBENATE DIMEGLUMINE 529 MG/ML IV SOLN
20.0000 mL | Freq: Once | INTRAVENOUS | Status: AC | PRN
  Administered 2015-10-21: 20 mL via INTRAVENOUS

## 2015-10-21 MED ORDER — PHENOL 1.4 % MT LIQD
1.0000 | OROMUCOSAL | Status: DC | PRN
  Filled 2015-10-21: qty 177

## 2015-10-21 MED ORDER — FLUTICASONE PROPIONATE 50 MCG/ACT NA SUSP
1.0000 | Freq: Every day | NASAL | Status: DC
  Administered 2015-10-22 – 2015-10-25 (×4): 1 via NASAL
  Filled 2015-10-21: qty 16

## 2015-10-21 MED ORDER — ONDANSETRON HCL 4 MG/2ML IJ SOLN
4.0000 mg | Freq: Once | INTRAMUSCULAR | Status: AC
  Administered 2015-10-21: 4 mg via INTRAVENOUS
  Filled 2015-10-21: qty 2

## 2015-10-21 MED ORDER — IPRATROPIUM-ALBUTEROL 20-100 MCG/ACT IN AERS: 1.0000 | INHALATION_SPRAY | Freq: Four times a day (QID) | RESPIRATORY_TRACT | Status: DC | PRN

## 2015-10-21 MED ORDER — IPRATROPIUM-ALBUTEROL 0.5-2.5 (3) MG/3ML IN SOLN: 3.0000 mL | Freq: Four times a day (QID) | RESPIRATORY_TRACT | Status: DC | PRN

## 2015-10-21 MED ORDER — SODIUM CHLORIDE 0.9% FLUSH
3.0000 mL | INTRAVENOUS | Status: DC | PRN
  Administered 2015-10-24: 3 mL via INTRAVENOUS
  Filled 2015-10-21: qty 3

## 2015-10-21 NOTE — ED Notes (Signed)
[facility] presents with a 70 yo female from home with a fall from bilateral leg weakness.  [facility] found patient on floor beside bed.  Pt stated she has been feeling bad all day and around 0030 she began feeling weak.  She went to check CBG and fell in bedroom; CBG on home monitor was 190 mg/dl.  [facility] recorded CBG of 191 mg/dl.  Pt stated her legs gave out bilaterally with numbness and that's how she fell.  Hx of HTN and diabetes.

## 2015-10-21 NOTE — Consult Note (Signed)
Neurology Consultation Reason for Consult: LE weakness Referring Physician: Criselda Peaches, E  CC: Lower extremity weakness  History is obtained from: Patient  HPI: SHETARA LAUNER is a 70 y.o. female with a history of diabetes, rheumatoid arthritis who presents with sudden onset bilateral leg weakness in association with lightheadedness. He states that she had gotten up to walk into the kitchen and there she sat down and then had sudden onset severe lightheadedness and noticed that both her legs felt very weak. She was therefore transported by EMS. She had also been planning of nausea. She states that she always has some urinary incontinence.  Since this started, her legs have improved considerably. She states that she could not lift her right leg off the bed at all initially, but now she is able to. She does not have any numbness of her legs.  ROS: A 14 point ROS was performed and is negative except as noted in the HPI.   Past Medical History  Diagnosis Date  . Diabetes mellitus without complication (HCC)   . Hypertension   . Sleep apnea   . Hypercholesteremia   . Rheumatoid arthritis (HCC)      Family History  Problem Relation Age of Onset  . Diabetes Brother   . Kidney disease Brother      Social History:  reports that she has been smoking Cigarettes.  She quit smokeless tobacco use yesterday. Her alcohol and drug histories are not on file.   Exam: Current vital signs: BP 142/87 mmHg  Pulse 95  Temp(Src) 98.3 F (36.8 C) (Oral)  Resp 18  Ht 5\' 8"  (1.727 m)  Wt 87.544 kg (193 lb)  BMI 29.35 kg/m2  SpO2 95% Vital signs in last 24 hours: Temp:  [97.8 F (36.6 C)-98.3 F (36.8 C)] 98.3 F (36.8 C) (05/11 0551) Pulse Rate:  [94-107] 95 (05/11 1615) Resp:  [17-22] 18 (05/11 1234) BP: (142-192)/(85-119) 142/87 mmHg (05/11 1615) SpO2:  [94 %-99 %] 95 % (05/11 1615) Weight:  [87.544 kg (193 lb)] 87.544 kg (193 lb) (05/11 0123)   Physical Exam  Constitutional: Appears  well-developed and well-nourished.  Psych: Affect appropriate to situation Eyes: No scleral injection HENT: No OP obstrucion Head: Normocephalic.  Cardiovascular: Normal rate and regular rhythm.  Respiratory: Effort normal and breath sounds normal to anterior ascultation GI: Soft.  No distension. There is no tenderness.  Skin: WDI  Neuro: Mental Status: Patient is awake, alert, oriented to person, place, month, year, and situation. Patient is able to give a clear and coherent history. No signs of aphasia or neglect Cranial Nerves: II: Visual Fields are full. Pupils are equal, round, and reactive to light.   III,IV, VI: EOMI without ptosis or diploplia.  V: Facial sensation is symmetric to temperature VII: Facial movement is symmetric.  VIII: hearing is intact to voice X: Uvula elevates symmetrically XI: Shoulder shrug is symmetric. XII: tongue is midline without atrophy or fasciculations.  Motor: Tone is normal. Bulk is normal. 5/5 strength was present in bilateral arms, she has 4/5 hip flexion on the right, 2/5 hip flexion on the left. She has good plantar/dorsiflexion bilaterally. She is able to lift her ankle off the bed on the left when her knee is supported. Sensory: Sensation is symmetric to light touch and temperature in the arms and legs. Deep Tendon Reflexes: 2+ and symmetric in the biceps and 1+ patellae. Absent ankle reflexes Cerebellar: Good finger nose finger bilaterally.   I have reviewed labs in epic and the  results pertinent to this consultation are: CMP-unremarkable  I have reviewed the images obtained: MRI lumbar spine-no clear explanation for her symptoms  Impression: 70 year old female with sudden onset lightheadedness and bilateral lower extremity weakness. This description is most typical for presyncope until you consider the duration of her lower extremity weakness that persisted after lightheadedness improved. With improving symptoms, I think  Guillain-Barr is extremely unlikely, and no need for lumbar puncture. One consideration would be if she has a single trunk for her bilateral ACAs and this could be a source for an ischemic cause of her lightheadedness/leg weakness. I think this is relatively unlikely, however.  Recommendations: 1) MRI brain/ MRA head 2) physical therapy 3) further recommendations following MRI of the brain   Ritta Slot, MD [facility] [phone]  If 7pm- 7am, please page neurology on call as listed in AMION.

## 2015-10-21 NOTE — ED Notes (Signed)
Pt in via [facility], transferred from [facility] for MRI.

## 2015-10-21 NOTE — ED Notes (Signed)
Order Renal Tray

## 2015-10-21 NOTE — H&P (Signed)
Date: 10/21/2015               Patient Name:  Cynthia Fuller MRN: [id number]  DOB: Dec 09, 1945 Age / Sex: 70 y.o., female   PCP: No primary care provider on file.         Medical Service: Internal Medicine Teaching Service         Attending Physician: Dr. Inez Catalina, MD    First Contact: Dr. Karma Greaser Pager: [phone]  Second Contact: Dr. Warner Mccreedy Pager: [phone]       After Hours (After 5p/  First Contact Pager: [phone]  weekends / holidays): Second Contact Pager: [phone]   Chief Complaint: Lower Extremity Weakenss  History of Present Illness: Cynthia Fuller is a 70 year old female with a past medical history of DM type 2, HTN, HLD, OSA who presents with acute onset bilateral lower extremity weakness. Patient reports that she has recently moved to [facility] and was started on atorvastatin in the past few weeks and metformin a week ago. Reports since starting metformin she has felt nauseated with generalized weakness and fatigue. No emesis. Last night, she became acutely weak in her b/l lower extremities. She denies any focal weakness prior to this and had been walking normally last night. Denies any falls. Reports having her friend help her to the couch and being unable to move either of her legs. Does report she checked her CBG when this happened and it was 190. Denies any trauma. Did report some back pain that occurred after lying down on the couch in an uncomfortable position. Reports she has back pain whenever lying on her back. Reported to the ED that she has baseline incontinence but reports to me she has no bowel or bladder incontinence.   She denies any recent illness. No fevers, chills, shortness of breath, chest pain, vision disturbances. No loss of bowel or bladder function. Does report a sick contact at home. Patient's friend staying at her house has URI symptoms. Patient denies any cough, rhinorrhea, diarrhea, abdominal pain, headaches. Does report a sore throat. States she has had it  for months. Resolved a few days ago with prednisone.   She initially agreed to LP in the ED. She now is refusing the procedure. Discussed the risks and benefits at length. Reported she would think about it.   Meds: Current Facility-Administered Medications  Medication Dose Route Frequency Provider Last Rate Last Dose  . LORazepam (ATIVAN) injection 1 mg  1 mg Intravenous Once PRN [name], MD       Current Outpatient Prescriptions  Medication Sig Dispense Refill  . ALPRAZolam (XANAX) 0.5 MG tablet Take 0.5 mg by mouth 3 (three) times daily as needed for anxiety.    [name] aspirin EC 81 MG tablet Take 81 mg by mouth daily.    [name] atorvastatin (LIPITOR) 40 MG tablet Take 40 mg by mouth daily.    . benazepril-hydrochlorthiazide (LOTENSIN HCT) 20-12.5 MG tablet Take 1 tablet by mouth daily.    . fluticasone (FLONASE) 50 MCG/ACT nasal spray Place 1 spray into both nostrils daily.    [name] ibuprofen (ADVIL,MOTRIN) 200 MG tablet Take 400 mg by mouth every 6 (six) hours as needed (pain).    . insulin aspart (NOVOLOG) 100 UNIT/ML injection Inject 0-8 Units into the skin 3 (three) times daily before meals. Sliding scale    . insulin glargine (LANTUS) 100 UNIT/ML injection Inject 35 Units into the skin at bedtime.    . Ipratropium-Albuterol (COMBIVENT RESPIMAT) 20-100 MCG/ACT  AERS respimat Inhale 1 puff into the lungs every 6 (six) hours as needed for wheezing.    . metFORMIN (GLUCOPHAGE-XR) 500 MG 24 hr tablet Take 500 mg by mouth daily with breakfast.    . predniSONE (DELTASONE) 5 MG tablet Take 10 mg by mouth daily.      Allergies: Allergies as of 10/21/2015 - Review Complete 10/21/2015  Allergen Reaction Noted  . Neomycin Hives 10/21/2015   Past Medical History  Diagnosis Date  . Diabetes mellitus without complication (HCC)   . Hypertension   . Sleep apnea   . Hypercholesteremia   . Rheumatoid arthritis [facility])    Past Surgical History  Procedure Laterality Date  . Ablation     Family History   Problem Relation Age of Onset  . Diabetes Brother   . Kidney disease Brother    Social History   Social History  . Marital Status: Divorced    Spouse Name: N/A  . Number of Children: N/A  . Years of Education: N/A   Occupational History  . Not on file.   Social History Main Topics  . Smoking status: Smoker, Current Status Unknown    Types: Cigarettes  . Smokeless tobacco: Former Neurosurgeon    Quit date: 10/20/2015  . Alcohol Use: Not on file  . Drug Use: Not on file  . Sexual Activity: Not on file   Other Topics Concern  . Not on file   Social History Narrative  . No narrative on file    Review of Systems: Pertinent items noted in HPI and remainder of comprehensive ROS otherwise negative.  Physical Exam: Blood pressure 169/113, pulse 103, temperature 98.3 F (36.8 C), temperature source Oral, resp. rate 18, height  (1.727 m), weight 193 lb (87.544 kg), SpO2 97 %. General: alert, obese female, lying comfortably in bed in no acute distress.   Head: normocephalic and atraumatic.  Eyes: vision grossly intact, pupils equal, pupils round, pupils reactive to light, no injection and anicteric  Mouth: pharynx pink and moist, no erythema, and no exudates.  Neck: supple, full ROM, no thyromegaly, no JVD, and no carotid bruits.  Lungs: normal respiratory effort, no accessory muscle use, normal breath sounds, no crackles, scattered wheezing. Heart: tachycardic, regular rhythm, no murmur, no gallop, and no rub.  Abdomen: soft, non-tender, normal bowel sounds, no distention, no guarding, no rebound tenderness Msk: no joint swelling, no joint warmth, and no redness over joints.  Pulses: 2+ DP/PT pulses bilaterally Extremities: No cyanosis, clubbing, edema Neurologic: patient only minimally cooperative with exam, AAOx3, CN II-XII intact, 5/5 strength in b/l UE, 4/5 strength 4/5 in b/l LE. Normal babinski b/l. 1+ reflex RLE, unable to elicit reflexes in LLE. Intact sensation  throughout.  Skin: turgor normal and no rashes.  Psych: normal mood and affect  Lab results: Basic Metabolic Panel:  Recent Labs  16/10/96 0200  NA 143  K 3.7  CL 100*  CO2 30  GLUCOSE 261*  BUN 16  CREATININE 0.82  CALCIUM 9.9   Liver Function Tests:  Recent Labs  10/21/15 0200  AST 19  ALT 16  ALKPHOS 48  BILITOT 0.6  PROT 8.2*  ALBUMIN 4.0    Recent Labs  10/21/15 0200  LIPASE 30   No results for input(s): AMMONIA in the last 72 hours. CBC:  Recent Labs  10/21/15 0200  WBC 9.8  NEUTROABS 7.4  HGB 12.8  HCT 39.0  MCV 91.5  PLT 315   Cardiac Enzymes: No results  for input(s): CKTOTAL, CKMB, CKMBINDEX, TROPONINI in the last 72 hours. BNP: No results for input(s): PROBNP in the last 72 hours. D-Dimer: No results for input(s): DDIMER in the last 72 hours. CBG:  Recent Labs  10/21/15 0118 10/21/15 1053  GLUCAP 230* 231*   Hemoglobin A1C: No results for input(s): HGBA1C in the last 72 hours. Fasting Lipid Panel: No results for input(s): CHOL, HDL, LDLCALC, TRIG, CHOLHDL, LDLDIRECT in the last 72 hours. Thyroid Function Tests: No results for input(s): TSH, T4TOTAL, FREET4, T3FREE, THYROIDAB in the last 72 hours. Anemia Panel: No results for input(s): VITAMINB12, FOLATE, FERRITIN, TIBC, IRON, RETICCTPCT in the last 72 hours. Coagulation: No results for input(s): LABPROT, INR in the last 72 hours. Urine Drug Screen: Drugs of Abuse  No results found for: LABOPIA, COCAINSCRNUR, LABBENZ, AMPHETMU, THCU, LABBARB  Alcohol Level: No results for input(s): ETH in the last 72 hours. Urinalysis:  Recent Labs  10/21/15 0201  COLORURINE YELLOW  LABSPEC 1.011  PHURINE 7.5  GLUCOSEU 100*  HGBUR NEGATIVE  BILIRUBINUR NEGATIVE  KETONESUR NEGATIVE  PROTEINUR 100*  NITRITE NEGATIVE  LEUKOCYTESUR NEGATIVE    Imaging results:  Dg Lumbar Spine Complete  10/21/2015  CLINICAL DATA:  Larey Seat this morning.  Low back pain. EXAM: LUMBAR SPINE - COMPLETE 4+  VIEW COMPARISON:  None. FINDINGS: There is no evidence of lumbar spine fracture. Alignment is normal. Intervertebral disc spaces are maintained. IMPRESSION: Negative. Electronically Signed   By: Ellery Plunk M.D.   On: 10/21/2015 04:22   Ct Head Wo Contrast  10/21/2015  CLINICAL DATA:  70 year old female with fall and bilateral leg weakness. EXAM: CT HEAD WITHOUT CONTRAST TECHNIQUE: Contiguous axial images were obtained from the base of the skull through the vertex without intravenous contrast. COMPARISON:  None. FINDINGS: The ventricles and sulci are appropriate in size for patient's age. Minimal periventricular and deep white matter chronic microvascular ischemic changes noted. There is no acute intracranial hemorrhage. No mass effect or midline shift. The visualized paranasal sinuses and mastoid air cells are clear. The calvarium is intact. IMPRESSION: No acute intracranial pathology. Electronically Signed   By: Elgie Collard M.D.   On: 10/21/2015 04:05   Mr Lumbar Spine W Wo Contrast  10/21/2015  CLINICAL DATA:  70 year old female status post fall. Nausea. Hyperglycemia. Back pain. Weakness. Initial encounter. EXAM: MRI LUMBAR SPINE WITHOUT AND WITH CONTRAST TECHNIQUE: Multiplanar and multiecho pulse sequences of the lumbar spine were obtained without and with intravenous contrast. CONTRAST:  34mL MULTIHANCE GADOBENATE DIMEGLUMINE 529 MG/ML IV SOLN COMPARISON:  Lumbar spine radiographs 0404 hours today. Chest CTA 08/16/2006. FINDINGS: Correlation of the chest CTA with today lumbar radiographs confirms the lowest full size ribs are T11, with hypoplastic or absent ribs at T12. Subsequently the L5 level is partially or mostly sacralized. Correlation with radiographs is recommended prior to any operative intervention. Trace anterolisthesis at L3-L4 and L4-L5. Otherwise normal vertebral height and alignment. Overall normal bone marrow signal. No marrow edema or evidence of acute osseous abnormality.  Motion artifact on post-contrast imaging. Visualized lower thoracic spinal cord is normal with conus medularis at L1. No abnormal intradural enhancement. Negative visualized abdominal viscera. Partially visible mild bladder distension. Posterior paraspinal soft tissues remarkable for mild subcutaneous edema and patchy bilateral abnormal T2 and STIR hyperintensity in the erector spinae muscles (thoracolumbar junction level on the left series 6, image 12 and lower lumbar spine on the right series 6, image 1). Associated mild patchy enhancement of the muscles. No associated fluid collection. T9-T10:  Negative. T10-T11: Negative. T11-T12:  Negative. T12-L1:  Negative. L1-L2:  Mild facet and ligament flavum hypertrophy. L2-L3: Minimal disc bulge. Mild facet and ligament flavum hypertrophy. L3-L4: Trace anterolisthesis. Mild circumferential disc bulge. Moderate facet hypertrophy. Mild L3 foraminal stenosis. L4-L5: Trace anterolisthesis. Circumferential disc bulge. Small central posterior annular fissure. Mild to moderate facet hypertrophy greater on the right. Mild right L4 foraminal stenosis. L5-S1:  Largely sacralized and negative. IMPRESSION: 1. No acute osseous abnormality in the lumbar spine. Transitional anatomy. 2. Occasional abnormal signal in the bilateral posterior paraspinal muscles. Legrand Rams this is posttraumatic muscle contusion or myositis given the scattered and patchy morphology. No associated fluid collection. 3. Mild degenerative spondylolisthesis at L3-L4 and L4-L5 with associated mild disc and moderate facet degeneration. No lumbar spinal or lateral recess stenosis. There is mild L3 and L4 foraminal stenosis. Electronically Signed   By: Odessa Fleming M.D.   On: 10/21/2015 07:49   Dg Abd Acute W/chest  10/21/2015  CLINICAL DATA:  70 year old female with nausea vomiting abdominal pain EXAM: DG ABDOMEN ACUTE W/ 1V CHEST COMPARISON:  Chest radiograph dated 06/06/2010 FINDINGS: Single-view of the chest does not  demonstrate a focal consolidation. There is mild increased vascular prominence concerning for mild congestion. There is no pleural effusion or pneumothorax. Stable cardiac silhouette. Moderate stool noted throughout the colon and within the rectum with possible fecal impaction. There is no evidence of bowel obstruction. No free air. No radiopaque calculi or foreign object. There is degenerative changes of the spine. No acute fracture. IMPRESSION: Mild congestion.  No focal consolidation. Constipation.  No bowel obstruction. Electronically Signed   By: Elgie Collard M.D.   On: 10/21/2015 02:50    Other results: EKG: normal EKG, normal sinus rhythm, there are no previous tracings available for comparison.  Assessment & Plan by Problem:  Acute Bilateral Lower Extremity Weakness: Patient with acute bilateral lower extremity weakness that started last night. No recent illnesses. No numbness or paresthesias. Patient denies any loss of bowel or bladder function though reported to the ED that she has chronic urinary incontinence. CT head in the ED was negative. Clinically not consistent with CVA. Lumbar MRI done in the ED showed no acute osseous abnormality. Mild degenerative spondylolisthesis at L3-L4 and L4-L5 with associated mild disc and moderate facet degermation. Patient is tachycardic and hypertensive. No other autonomic symptoms. Strength 4/5 in b/l LE with decreased reflexes though appears to be improving from initial presentation in the ED.  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 Now refusing LP. Did recently start a statin and metformin though unlikely to be the cause of her symptoms.  -Appreciate neuro's help -LP if patient agreeable. May need IR involvement as patient would not be agreeable to second attempt if unsuccessful on first attempt.  -PT/OT -Continuous pulse ox  Hypertension: BP elevated in the ED 160-170s systolic.. On benazepril-hctz 20-12.5 mg daily at home.  -Resume home  medications  DM Type 2: On lantus 35 units qhs at home. Recently started on SSI and metformin.  -Lantus 20 units qhs -SSI-s -CBG qac/hs -hold metformin  HLD: Recetnly started on atorvastatin.  -Holding home meds  OSA: Reports not using CPAP at home. Agreeable to using it here.  -CPAP qhs  Rheumatoid Arthritis: Takes prednisone at home. Unclear how often.  -hold prednisone  DVT PPx: lovenox  CODE: FULL   Dispo: Disposition is deferred at this time, awaiting improvement of current medical problems. Anticipated discharge in approximately 1-3 day(s).   The patient does not have  a current PCP (No primary care provider on file.) and does need an [facility] hospital follow-up appointment after discharge.  The patient does not have transportation limitations that hinder transportation to clinic appointments.  Signed: Valentino Nose, MD 10/21/2015, 4:26 PM

## 2015-10-21 NOTE — ED Notes (Signed)
Bed: QH47 Expected date:  Expected time:  Means of arrival:  Comments: EMS female/fall/back pain 157/90

## 2015-10-21 NOTE — ED Notes (Signed)
Pt back from MRI 

## 2015-10-21 NOTE — ED Provider Notes (Signed)
Patient sent to the [facility] ED by transfer from [facility] for MRI of lumbar spine. Patient has had 3 days of significant fatigue. Last night around midnight she experienced sudden onset bilateral lower extremity weakness and had a fall due to this. Patient has true weakness on exam and her bilateral lower extremities and decreased reflexes in BLE. Strength 5 out of 5 in bilateral upper extremities. Patient reports urinary incontinence at her baseline. MRI lumbar spine reveals no acute osseous abnormality. Occasional abnormal signal in bilateral posterior paraspinal muscles which is likely a posttraumatic muscle contusion or myositis given the scattered and patchy morphology. Otherwise MRI lumbar spine was unremarkable. Do not feel that this is contributing patient's symptoms. 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  - LP delayed as pt is requesting more pain medication prior to procedure.   11:22 AM Spoke with Dr. Amada Jupiter with neurology who will consult pt in ED.   Spoke with Internal Medical Resident service who will admit pt for further workup for GBS. They will perform LP on the floor. Pt has already signed consent for this procedure.   Lester Kinsman Conyers, PA-C 10/21/15 1534  Alvira Monday, MD 10/22/15 1139

## 2015-10-21 NOTE — ED Provider Notes (Signed)
CSN: [id number]     Arrival date & time 10/21/15  0113 History  By signing my name below, I, Marisue Humble, attest that this documentation has been prepared under the direction and in the presence of Shon Baton, MD . Electronically Signed: Marisue Humble, Scribe. 10/21/2015. 1:41 AM.   Chief Complaint  Patient presents with  . Fall  . Back Pain   The history is provided by the patient. No language interpreter was used.   HPI Comments:  Cynthia Fuller is a 70 y.o. female with PMHx of HLD, HTN and DM who presents to the Emergency Department via EMS s/p fall complaining of sudden onset nausea earlier tonight. Pt states she had been feeling bad all day and became nauseated ~1 hour ago. She checked her blood sugar which was 190. Pt reports associated tremors, weakness, back pain, and difficulty moving her legs bilaterally. She states it is always uncomfortable for her to lay on her back which is not abnormal for her. Pt believes she is having a stroke because she cannot move both of her legs or feet on her own. Pt slid between the bed and dresser during EMS evaluation but denies any other fall. Denies fever, diarrhea, chest pain, shortness of breath, or abdominal pain. Denies any recent illnesses.  Initially, history was difficult to obtain as patient was very anxious appearing. No obvious unilateral deficits. It is unclear whether the patient's back pain precipitated the fall was caused by the fall. Patient reports baseline stress incontinence. When asked if she had any new urinary symptoms today, patient states "I always pee all over myself." She at baseline wears a pad.  Past Medical History  Diagnosis Date  . Diabetes mellitus without complication (HCC)   . Hypertension   . Sleep apnea   . Hypercholesteremia    Past Surgical History  Procedure Laterality Date  . Ablation     History reviewed. No pertinent family history. Social History  Substance Use Topics  . Smoking status:  None  . Smokeless tobacco: None  . Alcohol Use: None   OB History    No data available     Review of Systems  Constitutional: Negative for fever.  Respiratory: Negative for shortness of breath.   Cardiovascular: Negative for chest pain.  Gastrointestinal: Positive for nausea. Negative for abdominal pain and diarrhea.  Genitourinary: Negative for difficulty urinating.       Urinary incontinence  Musculoskeletal: Positive for back pain.  Neurological: Positive for tremors and weakness. Negative for facial asymmetry, speech difficulty and headaches.  All other systems reviewed and are negative.  Allergies  Review of patient's allergies indicates not on file.  Home Medications   Prior to Admission medications   Not on File   BP 164/102 mmHg  Pulse 98  Temp(Src) 97.8 F (36.6 C) (Oral)  Resp 18  Ht 5\' 8"  (1.727 m)  Wt 193 lb (87.544 kg)  BMI 29.35 kg/m2  SpO2 98% Physical Exam  Constitutional: She is oriented to person, place, and time.  Anxious appearing, appears to not be able to be comfortable in bed, diaphoretic  HENT:  Head: Normocephalic and atraumatic.  Eyes: Pupils are equal, round, and reactive to light.  Cardiovascular: Normal rate, regular rhythm and normal heart sounds.   No murmur heard. Pulmonary/Chest: Effort normal and breath sounds normal. No respiratory distress. She has no wheezes.  Abdominal: Soft. Bowel sounds are normal. There is tenderness. There is no rebound and no guarding.  Mild epigastric  tenderness to palpation without rebound or guarding  Genitourinary:  Rectal tone present but slightly diminished, perianal sensation intact  Musculoskeletal: She exhibits edema.  Neurological: She is alert and oriented to person, place, and time.  Cranial nerves II through XII 12 intact, no facial droop noted, normal grip, biceps, triceps strength bilateral upper extremities, dorsi and plantar flexion intact bilateral lower extremities, patient unable to flex  or hold either leg in extension at the hip L>R, decreased reflexes bilaterally with patellar and absent clonus  Skin: Skin is warm and dry.  Psychiatric: She has a normal mood and affect.  Nursing note and vitals reviewed.   ED Course  Procedures  DIAGNOSTIC STUDIES:  Oxygen Saturation is 99% on RA, normal by my interpretation.    COORDINATION OF CARE:  1:33 AM Will order x-ray of chest and abdomen, Lipase, CBC, CMP, troponin, and UA. Discussed treatment plan with pt at bedside and pt agreed to plan.  3:38 AM Consult to neurology.  Labs Review Labs Reviewed  CBC WITH DIFFERENTIAL/PLATELET - Abnormal; Notable for the following:    RDW 15.6 (*)    All other components within normal limits  COMPREHENSIVE METABOLIC PANEL - Abnormal; Notable for the following:    Chloride 100 (*)    Glucose, Bld 261 (*)    Total Protein 8.2 (*)    All other components within normal limits  URINALYSIS, ROUTINE W REFLEX MICROSCOPIC (NOT AT [facility]) - Abnormal; Notable for the following:    APPearance CLOUDY (*)    Glucose, UA 100 (*)    Protein, ur 100 (*)    All other components within normal limits  URINE MICROSCOPIC-ADD ON - Abnormal; Notable for the following:    Squamous Epithelial / LPF 0-5 (*)    Bacteria, UA RARE (*)    All other components within normal limits  CBG MONITORING, ED - Abnormal; Notable for the following:    Glucose-Capillary 230 (*)    All other components within normal limits  LIPASE, BLOOD  I-STAT TROPOININ, ED    Imaging Review Ct Head Wo Contrast  10/21/2015  CLINICAL DATA:  70 year old female with fall and bilateral leg weakness. EXAM: CT HEAD WITHOUT CONTRAST TECHNIQUE: Contiguous axial images were obtained from the base of the skull through the vertex without intravenous contrast. COMPARISON:  None. FINDINGS: The ventricles and sulci are appropriate in size for patient's age. Minimal periventricular and deep white matter chronic microvascular ischemic changes noted.  There is no acute intracranial hemorrhage. No mass effect or midline shift. The visualized paranasal sinuses and mastoid air cells are clear. The calvarium is intact. IMPRESSION: No acute intracranial pathology. Electronically Signed   By: Elgie Collard M.D.   On: 10/21/2015 04:05   Dg Abd Acute W/chest  10/21/2015  CLINICAL DATA:  70 year old female with nausea vomiting abdominal pain EXAM: DG ABDOMEN ACUTE W/ 1V CHEST COMPARISON:  Chest radiograph dated 06/06/2010 FINDINGS: Single-view of the chest does not demonstrate a focal consolidation. There is mild increased vascular prominence concerning for mild congestion. There is no pleural effusion or pneumothorax. Stable cardiac silhouette. Moderate stool noted throughout the colon and within the rectum with possible fecal impaction. There is no evidence of bowel obstruction. No free air. No radiopaque calculi or foreign object. There is degenerative changes of the spine. No acute fracture. IMPRESSION: Mild congestion.  No focal consolidation. Constipation.  No bowel obstruction. Electronically Signed   By: Elgie Collard M.D.   On: 10/21/2015 02:50   I have  personally reviewed and evaluated these images and lab results as part of my medical decision-making.   EKG Interpretation   Date/Time:  Thursday Oct 21 2015 01:38:41 EDT Ventricular Rate:  93 PR Interval:  205 QRS Duration: 84 QT Interval:  364 QTC Calculation: 453 R Axis:   37 Text Interpretation:  Sinus rhythm Biatrial enlargement Abnormal R-wave  progression, early transition Confirmed by Jayvien Rowlette  MD, Haifa Hatton ([id number]) on  10/21/2015 1:53:37 AM      MDM   Final diagnoses:  Weakness  Weakness of both lower extremities    Patient presents with generalized weakness and not feeling well. Reports bilateral lower extremity weakness. Initially very difficult obtaining history, she was very anxious. It is unclear whether she had back pain initially or developed back pain after the  fall. Fall per history seems relatively atraumatic and more of "sliding off the bed." Patient is afebrile. Anxious but nontoxic-appearing. Neurologically, she has decreased proximal muscle strength in the lower extremities but intact distal strength. Appears to have diminished reflexes. Unclear whether she has any new urinary symptoms as she has fairly significant incontinence at baseline. She did have an episode of incontinence while in the emergency room. Patient was given anxiety medication as well as Zofran. Basic labwork obtained and largely reassuring. No metabolic derangement. Glucose is normal. No evidence of leukocytosis. Attempted to ambulate the patient. She will not put weight on either lower extremity and appears to have diminished strength bilaterally left greater than right. Again it appears to be more proximal. Discussed with Dr. Roseanne Reno. Will transferred to [facility] ER for a MRI of the lumbar spine. If this is negative, patient may need evaluation for possible Guillian barr syndrome. Patient does not have any unilateral weakness and doubt stroke at this time. However, CT head was obtained.  This is reassuring.  Discussed the patient with Dr. Mora Bellman.  Will transfer to ER.  I personally performed the services described in this documentation, which was scribed in my presence. The recorded information has been reviewed and is accurate.    Shon Baton, MD 10/21/15 [phone]

## 2015-10-21 NOTE — ED Notes (Signed)
Ordered diet tray 

## 2015-10-21 NOTE — ED Notes (Signed)
Notified RN,Terrance pt. CBG 230.

## 2015-10-21 NOTE — H&P (Signed)
Date: 10/21/2015               Patient Name:  Cynthia Fuller MRN: [id number]  DOB: [birth year] Age / Sex: 70 y.o., female   PCP: No primary care provider on file.              Medical Service: Internal Medicine Teaching Service              Attending Physician: Dr. Inez Catalina, MD    First Contact: Nida Boatman, MS 3 Pager: [phone]  Second Contact: Dr. Karma Greaser Pager: [phone]  Third Contact Dr. Tasia Catchings Pager: [phone]       After Hours (After 5p/  First Contact Pager: [phone]  weekends / holidays): Second Contact Pager: [phone]   Chief Complaint: Bilateral lower extremity weakness   History of Present Illness: Cynthia Fuller is a 70 yo F with history of DM, HTN, hyperlipidemia, sleep apnea, and RA who was transferred from [facility] ED after presenting last night with sudden onset lower leg weakness.  She states that around midnight she was getting up to take her Lantus and felt dizzy and began to sweat.  She moved to the chair and then got down to the ground because she was suddenly unable to move her legs.  She said she was lying on her back unable to move her legs until her girlfriend called EMS to bring her to the ED.  Pt says she had complete loss of motor function in both lower extremities, but no shooting pains or loss of sensation.  Was able to move hips and bilateral upper extremities.  She denies falling to the ground, denies hitting head or losing consciousness.  She says her back continues to hurt from lying on the ground.  She says nothing like this has ever happened before and she had no symptoms of leg weakness prior to this acute loss of motor function.  She denies losing bladder or bowel function during this episode.  She continues to endorse lower leg weakness although she is able to move both lower extremities a little at this time.  Denies numbness, paraesthesias or pain in her legs.  Pt says she hasn't been feeling well for the past week after starting Metformin SSI, and a statin.   She has felt fatigued and nauseous for the past week, but has not vomited or had extremity weakness. Denies any diarrhea or change in bowel movements.  Her girlfriend has been sick with bronchitis recently, but pt denies any coughs.  Pt has had sore throats intermittently for the past 6 months, which she treats with Prednisone PRN that is prescribed to her for her RA.  Pt used prednisone yesterday and two days ago.  Pt normally has good lower extremity strength and is able to walk her dog during the day.  She does have a history of sleep apnea but has not used her CPap since moving to [place] a few weeks ago.  She has a 40 year smoking history and smokes 5-10 cigarettes per day, although she says she "stopped smoking yesterday".  Denies any alcohol or drug use.  Pt's daughter and pastor are at bedside.     En route to [facility] last night, EMS recorded CBG of 191.  She received ativan at [facility] for anxiety and was put on 2L O2 after sats dropped to 85%.   CT head and L spine negative.    Was transferred to [facility] for MRI and further workup.  Meds: Current Facility-Administered Medications  Medication Dose Route Frequency Provider Last Rate Last Dose  . LORazepam (ATIVAN) injection 1 mg  1 mg Intravenous Once PRN [name], MD       Current Outpatient Prescriptions  Medication Sig Dispense Refill  . aspirin EC 81 MG tablet Take 81 mg by mouth daily.    [name] ibuprofen (ADVIL,MOTRIN) 200 MG tablet Take 400 mg by mouth every 6 (six) hours as needed (pain).      Allergies: Allergies as of 10/21/2015 - never reviewed  Allergen Reaction Noted  . Neomycin Hives 10/21/2015   Past Medical History  Diagnosis Date  . Diabetes mellitus without complication (HCC)   . Hypertension   . Sleep apnea   . Hypercholesteremia   . Rheumatoid arthritis [facility])    Past Surgical History  Procedure Laterality Date  . Ablation     Family History  Problem Relation Age of Onset  . Diabetes Brother   . Kidney  disease Brother    Social History   Social History  . Marital Status: Divorced    Spouse Name: N/A  . Number of Children: N/A  . Years of Education: N/A   Occupational History  . Not on file.   Social History Main Topics  . Smoking status: Smoker, Current Status Unknown    Types: Cigarettes  . Smokeless tobacco: Former Neurosurgeon    Quit date: 10/20/2015  . Alcohol Use: Not on file  . Drug Use: Not on file  . Sexual Activity: Not on file   Other Topics Concern  . Not on file   Social History Narrative  . No narrative on file    Review of Systems: Pertinent items are noted in HPI. No diarrhea.   Physical Exam: Blood pressure 169/113, pulse 103, temperature 98.3 F (36.8 C), temperature source Oral, resp. rate 18, height 5\' 8"  (1.727 m), weight 87.544 kg (193 lb), SpO2 97 %. BP 169/113 mmHg  Pulse 103  Temp(Src) 98.3 F (36.8 C) (Oral)  Resp 18  Ht 5\' 8"  (1.727 m)  Wt 87.544 kg (193 lb)  BMI 29.35 kg/m2  SpO2 97%  General Appearance:    Alert, frustrated. No acute distress.   Head:    Normocephalic, without obvious abnormality, atraumatic  Eyes:    PERRL, conjunctiva/corneas clear, EOM's intact, fundi    benign, both eyes  Throat:   Lips, mucosa, and tongue normal; teeth and gums normal  Lungs:     Clear to auscultation bilaterally, respirations unlabored  Chest Wall:    No tenderness or deformity   Heart:    RRR.  Low-grade murmur over R sternal border.  2+ radial pulses.   Abdomen:     Soft, non-tender, bowel sounds active all four quadrants  Extremities:   Extremities warm, dry.  No cyanosis or edema  Neurologic:   CN 2,3,4,5,6,9,10 intact.  Had difficulty reproducing smile bilaterally.  Able to puff out cheeks, keep eyes closed against pressure.  Normal sensation in bilateral face, bilateral upper extremities, bilateral lower extremities.  Appropriate Babinski response bilaterally.  2+ patellar reflex on R.  Unable to elicit patellar reflex on L.  5/5 strength with  pedal dorsiflexion and plantarflexion.  4/5 strength in bilateral lower extremities.  5/5 strength in bilateral upper extremities.        Lab results: CBC    Component Value Date/Time   WBC 9.8 10/21/2015 0200   RBC 4.26 10/21/2015 0200   HGB 12.8 10/21/2015 0200   HCT  39.0 10/21/2015 0200   PLT 315 10/21/2015 0200   MCV 91.5 10/21/2015 0200   MCH 30.0 10/21/2015 0200   MCHC 32.8 10/21/2015 0200   RDW 15.6* 10/21/2015 0200   LYMPHSABS 2.0 10/21/2015 0200   MONOABS 0.4 10/21/2015 0200   EOSABS 0.0 10/21/2015 0200   BASOSABS 0.0 10/21/2015 0200    BMP Latest Ref Rng 10/21/2015  Glucose 65 - 99 mg/dL 341(P)  BUN 6 - 20 mg/dL 16  Creatinine 3.79 - 0.24 mg/dL 0.97  Sodium 353 - 299 mmol/L 143  Potassium 3.5 - 5.1 mmol/L 3.7  Chloride 101 - 111 mmol/L 100(L)  CO2 22 - 32 mmol/L 30  Calcium 8.9 - 10.3 mg/dL 9.9    Imaging results:  Dg Lumbar Spine Complete  10/21/2015  CLINICAL DATA:  Larey Seat this morning.  Low back pain. EXAM: LUMBAR SPINE - COMPLETE 4+ VIEW COMPARISON:  None. FINDINGS: There is no evidence of lumbar spine fracture. Alignment is normal. Intervertebral disc spaces are maintained. IMPRESSION: Negative. Electronically Signed   By: Ellery Plunk M.D.   On: 10/21/2015 04:22   Ct Head Wo Contrast  10/21/2015  CLINICAL DATA:  69 year old female with fall and bilateral leg weakness. EXAM: CT HEAD WITHOUT CONTRAST TECHNIQUE: Contiguous axial images were obtained from the base of the skull through the vertex without intravenous contrast. COMPARISON:  None. FINDINGS: The ventricles and sulci are appropriate in size for patient's age. Minimal periventricular and deep white matter chronic microvascular ischemic changes noted. There is no acute intracranial hemorrhage. No mass effect or midline shift. The visualized paranasal sinuses and mastoid air cells are clear. The calvarium is intact. IMPRESSION: No acute intracranial pathology. Electronically Signed   By: Elgie Collard  M.D.   On: 10/21/2015 04:05   Mr Lumbar Spine W Wo Contrast  10/21/2015  CLINICAL DATA:  70 year old female status post fall. Nausea. Hyperglycemia. Back pain. Weakness. Initial encounter. EXAM: MRI LUMBAR SPINE WITHOUT AND WITH CONTRAST TECHNIQUE: Multiplanar and multiecho pulse sequences of the lumbar spine were obtained without and with intravenous contrast. CONTRAST:  5mL MULTIHANCE GADOBENATE DIMEGLUMINE 529 MG/ML IV SOLN COMPARISON:  Lumbar spine radiographs 0404 hours today. Chest CTA 08/16/2006. FINDINGS: Correlation of the chest CTA with today lumbar radiographs confirms the lowest full size ribs are T11, with hypoplastic or absent ribs at T12. Subsequently the L5 level is partially or mostly sacralized. Correlation with radiographs is recommended prior to any operative intervention. Trace anterolisthesis at L3-L4 and L4-L5. Otherwise normal vertebral height and alignment. Overall normal bone marrow signal. No marrow edema or evidence of acute osseous abnormality. Motion artifact on post-contrast imaging. Visualized lower thoracic spinal cord is normal with conus medularis at L1. No abnormal intradural enhancement. Negative visualized abdominal viscera. Partially visible mild bladder distension. Posterior paraspinal soft tissues remarkable for mild subcutaneous edema and patchy bilateral abnormal T2 and STIR hyperintensity in the erector spinae muscles (thoracolumbar junction level on the left series 6, image 12 and lower lumbar spine on the right series 6, image 1). Associated mild patchy enhancement of the muscles. No associated fluid collection. T9-T10:  Negative. T10-T11: Negative. T11-T12:  Negative. T12-L1:  Negative. L1-L2:  Mild facet and ligament flavum hypertrophy. L2-L3: Minimal disc bulge. Mild facet and ligament flavum hypertrophy. L3-L4: Trace anterolisthesis. Mild circumferential disc bulge. Moderate facet hypertrophy. Mild L3 foraminal stenosis. L4-L5: Trace anterolisthesis.  Circumferential disc bulge. Small central posterior annular fissure. Mild to moderate facet hypertrophy greater on the right. Mild right L4 foraminal stenosis. L5-S1:  Largely  sacralized and negative. IMPRESSION: 1. No acute osseous abnormality in the lumbar spine. Transitional anatomy. 2. Occasional abnormal signal in the bilateral posterior paraspinal muscles. Legrand Rams this is posttraumatic muscle contusion or myositis given the scattered and patchy morphology. No associated fluid collection. 3. Mild degenerative spondylolisthesis at L3-L4 and L4-L5 with associated mild disc and moderate facet degeneration. No lumbar spinal or lateral recess stenosis. There is mild L3 and L4 foraminal stenosis. Electronically Signed   By: Odessa Fleming M.D.   On: 10/21/2015 07:49   Dg Abd Acute W/chest  10/21/2015  CLINICAL DATA:  70 year old female with nausea vomiting abdominal pain EXAM: DG ABDOMEN ACUTE W/ 1V CHEST COMPARISON:  Chest radiograph dated 06/06/2010 FINDINGS: Single-view of the chest does not demonstrate a focal consolidation. There is mild increased vascular prominence concerning for mild congestion. There is no pleural effusion or pneumothorax. Stable cardiac silhouette. Moderate stool noted throughout the colon and within the rectum with possible fecal impaction. There is no evidence of bowel obstruction. No free air. No radiopaque calculi or foreign object. There is degenerative changes of the spine. No acute fracture. IMPRESSION: Mild congestion.  No focal consolidation. Constipation.  No bowel obstruction. Electronically Signed   By: Elgie Collard M.D.   On: 10/21/2015 02:50    Other results: EKG: Sinus rhythm Biatrial enlargement Abnormal R-wave progression, early transition  Assessment & Plan by Problem: 70 yo F with hx of HTN, DM, RA, sleep apnea presenting with sudden onset bilateral lower extremity weakness.   Lower extremity weakness In patient presenting with sudden onset bilateral lower  extremity weakness, am most concerned about Guillan-Barre Syndrome, which would explain bilateral nature of leg weakness.  Pt does not have a history of GI illness/diarrhea, which if present  would point more towards a diagnosis of Guillan-Barre, but given seriousness of GBS, cannot exclude at this time.  Would need LP to further workup for potential GBS.  Less concerned about stroke given negative CT and L spine results - which are negative for any acute hemorrhagic or ischemic event.  Stroke is also unlikely to present with bilateral weakness.  Cauda equina syndrome also on differential given lower extremity weakness, but less likely because of preserved sensation and function to bladder and rectum.  Polymyositis also on differential given bilateral nature of symptoms, but less likely given acute onset and involvement of both proximal and distal lower extremities.  -Neuro following, appreciate recs -LP if pt amenable to rule-in/rule-out GBS (would expect increased protein, normal glucose if GBS)  -Regular diet  Back pain Likely secondary to lying on back when unable to move last night.   -Tylenol PRN   HTN Restart home meds:  -Lotensin HCT (benazepril-HCTZ) 12.5mg  daily -Lipitor 40mg  daily  -ASA 81 mg   DM Has not been feeling well since Metformin began 1 week ago.  CBGs run in the high 100s.   -CBG qas, qhs -SSI  -Lantus at bedtime  -Hold Metformin  Sleep apnea Has not used CPap for last several weeks after moving homes.   -CPAP at night when sleeing  RA Takes Prednisone 5mg  PRN for RA pain. -Hold Prednisone   This is a Psychologist, occupational Note.  The care of the patient was discussed with Dr. Valentino Nose and the assessment and plan was formulated with their assistance.  Please see their note for official documentation of the patient encounter.   Signed: Nathaneil Canary, Med Student 10/21/2015, 3:20 PM

## 2015-10-21 NOTE — ED Notes (Signed)
Pt has visibly calmed down since receiving the po ativan although patient verbalizes that she "feels horrible". Pt more drowsy and pt states that she does have a history of sleep apnea. o2 sats dropped to 85% on Rm Air and his RN applied 2 liters o2 via [place]. Also pt was able to log roll on the stretcher with some assistance to get on the bedpan. Pt with moderate strength in bilateral upper extremities however pt with limited mobility of BLE.

## 2015-10-21 NOTE — ED Notes (Signed)
EKG given to EDP Horton,MD., for review.

## 2015-10-22 ENCOUNTER — Encounter (HOSPITAL_COMMUNITY): Payer: Self-pay | Admitting: *Deleted

## 2015-10-22 ENCOUNTER — Inpatient Hospital Stay (HOSPITAL_COMMUNITY): Payer: Medicare Other

## 2015-10-22 DIAGNOSIS — E785 Hyperlipidemia, unspecified: Secondary | ICD-10-CM

## 2015-10-22 LAB — GLUCOSE, CAPILLARY
GLUCOSE-CAPILLARY: 252 mg/dL — AB (ref 65–99)
Glucose-Capillary: 200 mg/dL — ABNORMAL HIGH (ref 65–99)
Glucose-Capillary: 222 mg/dL — ABNORMAL HIGH (ref 65–99)
Glucose-Capillary: 282 mg/dL — ABNORMAL HIGH (ref 65–99)

## 2015-10-22 LAB — MRSA PCR SCREENING: MRSA BY PCR: NEGATIVE

## 2015-10-22 MED ORDER — ATORVASTATIN CALCIUM 40 MG PO TABS
40.0000 mg | ORAL_TABLET | Freq: Every day | ORAL | Status: DC
  Administered 2015-10-22 – 2015-10-25 (×4): 40 mg via ORAL
  Filled 2015-10-22 (×4): qty 1

## 2015-10-22 MED ORDER — ALPRAZOLAM 0.5 MG PO TABS
1.0000 mg | ORAL_TABLET | Freq: Once | ORAL | Status: AC | PRN
  Administered 2015-10-22: 1 mg via ORAL
  Filled 2015-10-22: qty 2

## 2015-10-22 NOTE — Progress Notes (Signed)
Date: 10/22/2015  Patient name: Cynthia Fuller  Medical record number: [id number]  Date of birth: [birth year]   I have seen and evaluated Cynthia Fuller and discussed their care with the Residency Team. Briefly, Cynthia Fuller is a 70 yo woman with PMH of DM2, HTN, HLD, OSA who presented with acute onset of bilateral lower extremity weakness, worse on the left.  She notes that she was started on metformin and atorvastatin over the last few weeks and she thinks she is having a reaction to the metformin.  She had further generalized weakness, fatigue since starting the medication.  On the night prior to admission, while standing, she because acutely weak in her legs and had to sit down.  She did not lose consciousness.  She checked her blood sugar and it was 190.  She does report some back pain due to lying down.  Her weakness has slowly gotten better and she is now able to move her legs, but not stand up.  She reported some baseline incontinence to urine, but no new B/B incontinence.  She has had no recent illness, but does have chronic sore throat for which she reports being prescribed intermittent prednisone, though she can't quantify how often she takes this.  She does have a friend who has been ill recently.   She initially presented to [facility] ED and was transferred here as there was not capacity to do an L spine MRI there overnight per report.    Fam Hx, and/or Soc Hx : She has a family history of DM, she is a current smoker.   Filed Vitals:   10/22/15 0600 10/22/15 1132  BP: 183/89 116/77  Pulse: 81 100  Temp:  97.7 F (36.5 C)  Resp: 18 20   Physical exam Gen: Lying in bed, good spirits, no acute distress Eyes: Anicteric sclerae CV: mild tachycardia, NR, no murmur Pulm: Breathing comfortably, no audible wheezing Abd: Soft, NT, ND Ext: Thin, no edema, warm and dry Neuro: Moving all extremities, 5/5 UE, 4/5 bilateral lower.  Patellar reflex 2+ on the right, mute on left, neg babinski bilateral,  sensation intact to light touch throughout.  Did not test gait.   Pertinent data Glu 261 Cr 0.82  CBC WNL  UA negative for infection  XR spine - negative CT head - no acute pathology MR L spine -IMPRESSION: 1. No acute osseous abnormality in the lumbar spine. Transitional anatomy. 2. Occasional abnormal signal in the bilateral posterior paraspinal muscles. Legrand Rams this is posttraumatic muscle contusion or myositis given the scattered and patchy morphology. No associated fluid collection. 3. Mild degenerative spondylolisthesis at L3-L4 and L4-L5 with associated mild disc and moderate facet degeneration. No lumbar spinal or lateral recess stenosis. There is mild L3 and L4 foraminal stenosis MRI brain - pending  Assessment and Plan: I have seen and evaluated the patient as outlined above. I agree with the formulated Assessment and Plan as detailed in the residents' note, with the following changes:   1. Acute bilateral LE weakness - Metformin with weakness in 9% - Neurology following - MRI brain pending at this time.  - There was initially concern for GBS, but she is improving too rapidly.  No need for LP at this time.  - PT/OT - CK mildly elevated, this does not appear to be rhabdo, but would trend to see if on the rise.   2. Accelerated HTN - On benazapril-hctz at home - Increase to 40/25 if needed for BP control  3. DM2 - D/C metformin - CBG monitoring - SSI sensitive - Lantus at 20 units - up titrate to home dose if she is eating a regular diet  4. HLD - Hold statin  Cynthia Catalina, MD 5/12/201712:54 PM

## 2015-10-22 NOTE — Progress Notes (Signed)
Physical Therapy Evaluation Patient Details Name: Cynthia Fuller MRN: [id number] DOB: 1946-01-14 Today's Date: 10/22/2015   History of Present Illness  Ms. Vossler is a 70 yo F with history of DM, HTN, sleep apnea, and RA who  presented with syncope and sudden onset lower leg weakness.MRI brain and lumbar spine negative. Neurology suspects presyncope with subsequent somatization vs generalized asthenia      Clinical Impression  Pt admitted with above symptoms of unclear etiology. Patient lives alone and daughters present during evaluation are unable to help care for patient on discharge. Due to ?somatization, anticipate she may progress quickly with potential to return home alone at modified independent level. Pt currently with functional limitations due to the deficits listed below (see PT Problem List).  Pt will benefit from skilled PT to increase their independence and safety with mobility to allow discharge to the venue listed below.       Follow Up Recommendations CIR    Equipment Recommendations  Rolling walker with 5" wheels    Recommendations for Other Services Rehab consult;OT consult     Precautions / Restrictions Precautions Precautions: Fall      Mobility  Bed Mobility Overal bed mobility: Needs Assistance Bed Mobility: Sidelying to Sit   Sidelying to sit: Mod assist       General bed mobility comments: HOB flat, +rail, incr effort and time required assist to raise torso to sit  Transfers Overall transfer level: Needs assistance Equipment used: Rolling walker (2 wheeled) Transfers: Sit to/from [employer] Sit to Stand: Mod assist;Min assist Stand pivot transfers: Mod assist;Min assist       General transfer comment: stood x 3; pivot x3; vc for safe use of DME; progressivley less assistance each trial; knees with partial buckle during pivot intially  Ambulation/Gait             General Gait Details: pivotal steps between surfaces only;  will need 2 person assist to progress ambulation  Stairs            Wheelchair Mobility    Modified Rankin (Stroke Patients Only)       Balance Overall balance assessment: Needs assistance Sitting-balance support: No upper extremity supported;Feet supported Sitting balance-Leahy Scale: Fair     Standing balance support: Bilateral upper extremity supported Standing balance-Leahy Scale: Poor Standing balance comment: unsteady with sway upon standing each time                             Pertinent Vitals/Pain BP stable throughout; HR incr to 124 bpm with activity  Pain Assessment: No/denies pain    Home Living Family/patient expects to be discharged to:: Private residence Living Arrangements: Alone Available Help at Discharge: Family;Available PRN/intermittently Type of Home: House Home Access: Stairs to enter Entrance Stairs-Rails: Right;Left;Can reach both Entrance Stairs-Number of Steps: 3 Home Layout: One level Home Equipment: Cane - quad      Prior Function Level of Independence: Independent with assistive device(s)         Comments: reports she drives, grocery shops (holding cart); rarely uses quad cane (if not feeling well)     Hand Dominance        Extremity/Trunk Assessment   Upper Extremity Assessment: Generalized weakness           Lower Extremity Assessment: RLE deficits/detail;LLE deficits/detail RLE Deficits / Details: knee extension 3+ (?effort, but not breakthrough); ankle DF 5/5 LLE Deficits / Details: knee extension  3+ (?effort, but not breakthrough); ankle DF 4/5  Cervical / Trunk Assessment: Other exceptions  Communication   Communication: HOH  Cognition Arousal/Alertness: Awake/alert (sleeping initially; fully awake once eOB) Behavior During Therapy: Flat affect Overall Cognitive Status: Within Functional Limits for tasks assessed                      General Comments      Exercises         Assessment/Plan    PT Assessment Patient needs continued PT services  PT Diagnosis Generalized weakness;Difficulty walking   PT Problem List Decreased strength;Decreased activity tolerance;Decreased balance;Decreased mobility;Decreased knowledge of use of DME;Obesity  PT Treatment Interventions DME instruction;Gait training;Stair training;Functional mobility training;Therapeutic activities;Therapeutic exercise;Balance training;Patient/family education   PT Goals (Current goals can be found in the Care Plan section) Acute Rehab PT Goals Patient Stated Goal: regain strength to return home alone PT Goal Formulation: With patient Time For Goal Achievement: 10/29/15 Potential to Achieve Goals: Good    Frequency Min 3X/week   Barriers to discharge Decreased caregiver support daughters present and are unable to assist patient on discharge; pt confirmed she will be home alone    Co-evaluation               End of Session Equipment Utilized During Treatment: Gait belt Activity Tolerance: Patient limited by fatigue Patient left: in chair;with call bell/phone within reach;with chair alarm set;with family/visitor present Nurse Communication: Mobility status         Time: 2831-5176 PT Time Calculation (min) (ACUTE ONLY): 30 min   Charges:   PT Evaluation $PT Eval Moderate Complexity: 1 Procedure PT Treatments $Therapeutic Activity: 8-22 mins   PT G Codes:        Dwyane Dupree 11/11/15, 4:56 PM Pager [phone]

## 2015-10-22 NOTE — Progress Notes (Signed)
PT working with pt in room with transfers and short ambulation. Pt left in chair with alarm on - eating had already eaten some fast food brought in by family -  so picking at lunch.  Pt up in chair for 45 min before she says she's uncomfortable with back pain & trying to get out of chair without assist. Entered  room as chair alarm was going off. Family sitting in room watching TV - Nurse call light right there within reach. Pt assisted back to bed and medicated with Advil and Xanax for c/o feeling Nervous

## 2015-10-22 NOTE — Progress Notes (Signed)
Placed patient on CPAP for the night via auto-mode with minimum pressure set at 6cm and maximum pressure set at 18cm. Oxygen set at 2lpm  

## 2015-10-22 NOTE — Progress Notes (Signed)
Pt transferred per bed with all belongings per NT and RN  No teeth or glasses. Family here

## 2015-10-22 NOTE — Progress Notes (Signed)
Called 5 Chad spoke to [employer] with report. Pt will transfer per bed - has all belongings. Family aware of transfer and room.

## 2015-10-22 NOTE — Progress Notes (Signed)
NURSING PROGRESS NOTE  Cynthia Fuller [id number] Transfer Data: 10/22/2015 5:53 PM Attending Provider: Inez Catalina, MD PCP:No primary care provider on file. Code Status: full  Cynthia Fuller is a 70 y.o. female patient transferred from 2C  -No acute distress noted.  -No complaints of shortness of breath.  -No complaints of chest pain.   Cardiac Monitoring: Box # 0 in place. Cardiac monitor yields:no tele.  Blood pressure 119/77, pulse 94, temperature 98 F (36.7 C), temperature source Oral, resp. rate 45, height 5\' 2"  (1.575 m), weight 87.8 kg (193 lb 9 oz), SpO2 97 %.   IV Fluids:  IV in place, occlusive dsg intact without redness, IV cath forearm left, condition patent and no redness none.   Allergies:  Neomycin  Past Medical History:   has a past medical history of Diabetes mellitus without complication (HCC); Hypertension; Sleep apnea; Hypercholesteremia; and Rheumatoid arthritis (HCC).  Past Surgical History:   has past surgical history that includes Ablation.  Social History:   reports that she has been smoking Cigarettes.  She quit smokeless tobacco use 2 days ago.  Skin: intact  Patient/Family orientated to room. Information packet given to patient/family. Admission inpatient armband information verified with patient/family to include name and date of birth and placed on patient arm. Side rails up x 2, fall assessment and education completed with patient/family. Patient/family able to verbalize understanding of risk associated with falls and verbalized understanding to call for assistance before getting out of bed. Call light within reach. Patient/family able to voice and demonstrate understanding of unit orientation instructions.    Will continue to evaluate and treat per MD orders.

## 2015-10-22 NOTE — Progress Notes (Signed)
Subjective: Patient continues to improve, though she "feels crummy"  Exam: Filed Vitals:   10/22/15 0412 10/22/15 0600  BP: 169/99 183/89  Pulse:  81  Temp: 98.2 F (36.8 C)   Resp: 20 18   Gen: In bed, NAD Resp: non-labored breathing, no acute distress Abd: soft, nt  Neuro: MS: awake, alert, interactive and appropriate.  CN: face symmetric, EOMI Motor: 5/5 bilateral UE, gives poor effort at hips, but is able to lift both legs out of bed with encouragement. Sensory:intact to LT  Pertinent Labs: CK 260  Impression: 70 yo F with sudden onset bilateral leg weakness/dizziness with rapid improvement. Given bilateral nature without single trunk ACA, do not think TIA needs to be considered. With improvement, this is not GBS. I suspect presyncope with subsequent somatization vs generalized asthenia due to some medical disorder(e.g. Cold).   I do not think that further evaluation is at all likely to yield any further helpful information. If she were to re-worsen, then doing MRI C and T spine.   I do not think that the borderline CK elevation is clinically relevant from a muscular standpoint.   Recommendations: 1) Physical therapy 2) No further evaluation at this time, please call with further questions or concerns.   Ritta Slot, MD [facility] [phone]  If 7pm- 7am, please page neurology on call as listed in AMION.

## 2015-10-22 NOTE — Progress Notes (Signed)
Subjective: Cynthia Fuller was seen and examined on morning rounds.  She says her bilateral lower leg weakness has improved, but that she still does not feel like she is able to get out of bed and walk around.  She says her back is sore from lying on her back.  Denies loss of sensation in her legs or any shooting pains.  No loss of bowel or bladder function.  Discussed with patient and her family the MRI and lab results, which did not show any brain changes which would have caused symptoms.  Discussed neurologist's thoughts regarding pre-syncopal episode and effort-dependent weakness causing symptoms.  Patient and family seemed to understand.  Plan for PT eval today.    Objective: Vital signs in last 24 hours: Filed Vitals:   10/22/15 1100 10/22/15 1132 10/22/15 1254 10/22/15 1258  BP:  116/77 146/83   Pulse:  100  102  Temp:  97.7 F (36.5 C)    TempSrc:  Oral    Resp:  20 23 25   Height:      Weight:      SpO2: 96% 97%  96%   Weight change: 0.256 kg (9 oz)  Intake/Output Summary (Last 24 hours) at 10/22/15 1303 Last data filed at 10/22/15 1300  Gross per 24 hour  Intake    440 ml  Output   2550 ml  Net  -2110 ml   General: Well-appearing, cooperative Lungs: Normal respiratory effort Heart: RRR. No murmurs.  Extremities: Warm, dry  Neuro: Normal sensation bilaterally.  Able to extend both legs above bed.  +Patellar reflexes bilaterally.  Normal Babinski reflex.    Lab Results: CBC    Component Value Date/Time   WBC 9.8 10/21/2015 0200   RBC 4.26 10/21/2015 0200   HGB 12.8 10/21/2015 0200   HCT 39.0 10/21/2015 0200   PLT 315 10/21/2015 0200   MCV 91.5 10/21/2015 0200   MCH 30.0 10/21/2015 0200   MCHC 32.8 10/21/2015 0200   RDW 15.6* 10/21/2015 0200   LYMPHSABS 2.0 10/21/2015 0200   MONOABS 0.4 10/21/2015 0200   EOSABS 0.0 10/21/2015 0200   BASOSABS 0.0 10/21/2015 0200   BMP Latest Ref Rng 10/21/2015  Glucose 65 - 99 mg/dL 974(B)  BUN 6 - 20 mg/dL 16  Creatinine 6.38 -  1.00 mg/dL 4.53  Sodium 646 - 803 mmol/L 143  Potassium 3.5 - 5.1 mmol/L 3.7  Chloride 101 - 111 mmol/L 100(L)  CO2 22 - 32 mmol/L 30  Calcium 8.9 - 10.3 mg/dL 9.9   CK: 212 (elevated) B12: normal  HbA1c and Vit D pending   Micro Results: Recent Results (from the past 240 hour(s))  MRSA PCR Screening     Status: None   Collection Time: 10/21/15 11:33 PM  Result Value Ref Range Status   MRSA by PCR NEGATIVE NEGATIVE Final    Comment:        The GeneXpert MRSA Assay (FDA approved for NASAL specimens only), is one component of a comprehensive MRSA colonization surveillance program. It is not intended to diagnose MRSA infection nor to guide or monitor treatment for MRSA infections.    Studies/Results: Dg Lumbar Spine Complete  10/21/2015  CLINICAL DATA:  Larey Seat this morning.  Low back pain. EXAM: LUMBAR SPINE - COMPLETE 4+ VIEW COMPARISON:  None. FINDINGS: There is no evidence of lumbar spine fracture. Alignment is normal. Intervertebral disc spaces are maintained. IMPRESSION: Negative. Electronically Signed   By: Ellery Plunk M.D.   On: 10/21/2015 04:22  Ct Head Wo Contrast  10/21/2015  CLINICAL DATA:  70 year old female with fall and bilateral leg weakness. EXAM: CT HEAD WITHOUT CONTRAST TECHNIQUE: Contiguous axial images were obtained from the base of the skull through the vertex without intravenous contrast. COMPARISON:  None. FINDINGS: The ventricles and sulci are appropriate in size for patient's age. Minimal periventricular and deep white matter chronic microvascular ischemic changes noted. There is no acute intracranial hemorrhage. No mass effect or midline shift. The visualized paranasal sinuses and mastoid air cells are clear. The calvarium is intact. IMPRESSION: No acute intracranial pathology. Electronically Signed   By: Elgie Collard M.D.   On: 10/21/2015 04:05   Mr Maxine Glenn Head Wo Contrast  10/22/2015  CLINICAL DATA:  70 year old female with acute onset bilateral  lower extremity weakness, lightheadedness. Negative brain diffusion imaging yesterday. Initial encounter. EXAM: MRA HEAD WITHOUT CONTRAST TECHNIQUE: Angiographic images of the Circle of Willis were obtained using MRA technique without intravenous contrast. COMPARISON:  Brain diffusion-weighted imaging 10/21/2015. Noncontrast head CT 10/21/2015. FINDINGS: No intracranial mass effect or ventriculomegaly. Antegrade flow in the posterior circulation with fairly codominant distal vertebral arteries. No distal vertebral stenosis. Mild MOTSA artifact along the proximal basilar artery which otherwise appears normal. Normal SCA and PCA origins. Both posterior communicating arteries are present in her mildly tortuous. Normal PCA branches. Antegrade flow in the distal cervical ICAs and both ICA siphons. Evidence of calcified siphon atherosclerosis, but no siphon stenosis. Ophthalmic and posterior communicating artery origins are normal. Carotid termini are normal. MCA and ACA origins are normal. Mildly tortuous A1 segments. Anterior communicating artery and visualized bilateral ACA branches are within normal limits. Mildly tortuous MCA M1 segments. Bilateral MCA bifurcations and visualized bilateral MCA branches are within normal limits. IMPRESSION: Negative intracranial MRA. Electronically Signed   By: Odessa Fleming M.D.   On: 10/22/2015 09:52   Mr Brain Wo Contrast  10/21/2015  CLINICAL DATA:  Acute onset bilateral lower extremity weakness and lightheadedness after walking into kitchen. Associated nausea and urinary incontinence. History of hypertension, rheumatoid arthritis and diabetes. EXAM: MRI HEAD WITHOUT CONTRAST TECHNIQUE: Axial diffusion weighted imaging obtained on a 1.5 tesla scanner. Patient was unable to tolerate further imaging due to claustrophobia and anxiety and examination was prematurely terminated. COMPARISON:  CT HEAD Oct 21, 2015 at 0355 hours FINDINGS: No reduced diffusion to suggest acute ischemia. No  signal abnormality to suggest hemorrhage. Ventricles and sulci are normal for a patient's age. No midline shift, mass effect. IMPRESSION: Limited single sequence MRI of the brain: No acute ischemia. Patient is scheduled to return tomorrow to complete exam. Electronically Signed   By: Awilda Metro M.D.   On: 10/21/2015 22:03   Mr Lumbar Spine W Wo Contrast  10/21/2015  CLINICAL DATA:  70 year old female status post fall. Nausea. Hyperglycemia. Back pain. Weakness. Initial encounter. EXAM: MRI LUMBAR SPINE WITHOUT AND WITH CONTRAST TECHNIQUE: Multiplanar and multiecho pulse sequences of the lumbar spine were obtained without and with intravenous contrast. CONTRAST:  68mL MULTIHANCE GADOBENATE DIMEGLUMINE 529 MG/ML IV SOLN COMPARISON:  Lumbar spine radiographs 0404 hours today. Chest CTA 08/16/2006. FINDINGS: Correlation of the chest CTA with today lumbar radiographs confirms the lowest full size ribs are T11, with hypoplastic or absent ribs at T12. Subsequently the L5 level is partially or mostly sacralized. Correlation with radiographs is recommended prior to any operative intervention. Trace anterolisthesis at L3-L4 and L4-L5. Otherwise normal vertebral height and alignment. Overall normal bone marrow signal. No marrow edema or evidence of acute osseous abnormality.  Motion artifact on post-contrast imaging. Visualized lower thoracic spinal cord is normal with conus medularis at L1. No abnormal intradural enhancement. Negative visualized abdominal viscera. Partially visible mild bladder distension. Posterior paraspinal soft tissues remarkable for mild subcutaneous edema and patchy bilateral abnormal T2 and STIR hyperintensity in the erector spinae muscles (thoracolumbar junction level on the left series 6, image 12 and lower lumbar spine on the right series 6, image 1). Associated mild patchy enhancement of the muscles. No associated fluid collection. T9-T10:  Negative. T10-T11: Negative. T11-T12:  Negative.  T12-L1:  Negative. L1-L2:  Mild facet and ligament flavum hypertrophy. L2-L3: Minimal disc bulge. Mild facet and ligament flavum hypertrophy. L3-L4: Trace anterolisthesis. Mild circumferential disc bulge. Moderate facet hypertrophy. Mild L3 foraminal stenosis. L4-L5: Trace anterolisthesis. Circumferential disc bulge. Small central posterior annular fissure. Mild to moderate facet hypertrophy greater on the right. Mild right L4 foraminal stenosis. L5-S1:  Largely sacralized and negative. IMPRESSION: 1. No acute osseous abnormality in the lumbar spine. Transitional anatomy. 2. Occasional abnormal signal in the bilateral posterior paraspinal muscles. Legrand Rams this is posttraumatic muscle contusion or myositis given the scattered and patchy morphology. No associated fluid collection. 3. Mild degenerative spondylolisthesis at L3-L4 and L4-L5 with associated mild disc and moderate facet degeneration. No lumbar spinal or lateral recess stenosis. There is mild L3 and L4 foraminal stenosis. Electronically Signed   By: Odessa Fleming M.D.   On: 10/21/2015 07:49   Dg Abd Acute W/chest  10/21/2015  CLINICAL DATA:  70 year old female with nausea vomiting abdominal pain EXAM: DG ABDOMEN ACUTE W/ 1V CHEST COMPARISON:  Chest radiograph dated 06/06/2010 FINDINGS: Single-view of the chest does not demonstrate a focal consolidation. There is mild increased vascular prominence concerning for mild congestion. There is no pleural effusion or pneumothorax. Stable cardiac silhouette. Moderate stool noted throughout the colon and within the rectum with possible fecal impaction. There is no evidence of bowel obstruction. No free air. No radiopaque calculi or foreign object. There is degenerative changes of the spine. No acute fracture. IMPRESSION: Mild congestion.  No focal consolidation. Constipation.  No bowel obstruction. Electronically Signed   By: Elgie Collard M.D.   On: 10/21/2015 02:50   Medications: I have reviewed the patient's  current medications. Scheduled Meds: . aspirin EC  81 mg Oral Daily  . benazepril  20 mg Oral Daily   And  . hydrochlorothiazide  12.5 mg Oral Daily  . enoxaparin (LOVENOX) injection  40 mg Subcutaneous Q24H  . fluticasone  1 spray Each Nare Daily  . insulin aspart  0-9 Units Subcutaneous TID WC  . insulin glargine  20 Units Subcutaneous QHS  . sodium chloride flush  3 mL Intravenous Q12H   Continuous Infusions:  PRN Meds:.ALPRAZolam, ibuprofen, ipratropium-albuterol, phenol, sodium chloride flush Assessment/Plan:  Lower extremity weakness Pt is regaining strength in her legs.  MRI brain and L spine did not show any changes that could cause symptoms - no signs of ischemic stroke.  Neurology following, and is not concerned for Guillan-Barre at this time due to improving symptoms - no need for LP.  Neurology believes this is likely pre-syncope with effort-dependent loss of strength/somatization in setting of anxiety vs. Recent fatigue.  Plan for discharge once cleared by PT and if weakness persists or returns, follow-up for C, T spine MRI.  Pt amenable to plan. -Neurology following.  Reassured at this time.  -PT/OT eval - will determine discharge status   HTN Hypertensive to 160s overnight.  Improved with benazepril-HCTZ home meds  this AM.  BP now 116/77 -Continue benzapril-HCTZ  -Continue ASA    DM Has not been feeling well since Metformin began 1 week ago. CBGs range from 200-267.  -CBG qas, qhs -SSI  -Lantus at bedtime - increase to 35 units  -Hold Metformin   DVT Prophylaxis: Enoxaparin   This is a Psychologist, occupational Note.  The care of the patient was discussed with Dr. Valentino Nose and the assessment and plan formulated with their assistance.  Please see their attached note for official documentation of the daily encounter.   LOS: 1 day   Nathaneil Canary, Med Student 10/22/2015, 1:03 PM

## 2015-10-22 NOTE — Progress Notes (Signed)
   Subjective: No events overnight. Patient reports strength is improving but not back to baseline. Does report some back pain due to lying in bed.   Objective: Vital signs in last 24 hours: Filed Vitals:   10/22/15 1100 10/22/15 1132 10/22/15 1254 10/22/15 1258  BP:  116/77 146/83   Pulse:  100  102  Temp:  97.7 F (36.5 C)    TempSrc:  Oral    Resp:  20 23 25   Height:      Weight:      SpO2: 96% 97%  96%   Weight change: 9 oz (0.256 kg)  Intake/Output Summary (Last 24 hours) at 10/22/15 1356 Last data filed at 10/22/15 1300  Gross per 24 hour  Intake    440 ml  Output   2550 ml  Net  -2110 ml    Gen: Lying in bed, no acute distress Eyes: Anicteric sclerae CV: mild tachycardia, NR, no murmur Pulm: Breathing comfortably, no audible wheezing Abd: Soft, NT, ND Ext: Thin, no edema, warm and dry Neuro: Moving all extremities, 5/5 UE, 4/5 bilateral lower. Patellar reflex 2+ on the right, mute on left, neg babinski bilateral, sensation intact to light touch throughout.  Medications: I have reviewed the patient's current medications. Scheduled Meds: . aspirin EC  81 mg Oral Daily  . benazepril  20 mg Oral Daily   And  . hydrochlorothiazide  12.5 mg Oral Daily  . enoxaparin (LOVENOX) injection  40 mg Subcutaneous Q24H  . fluticasone  1 spray Each Nare Daily  . insulin aspart  0-9 Units Subcutaneous TID WC  . insulin glargine  20 Units Subcutaneous QHS  . sodium chloride flush  3 mL Intravenous Q12H   Continuous Infusions:  PRN Meds:.ALPRAZolam, ibuprofen, ipratropium-albuterol, phenol, sodium chloride flush Assessment/Plan:  Acute Bilateral Lower Extremity Weakness: Weakness improving since seen at [facility] initially. 4/5 in b/l LE. Neurology following. Do not believe this is GBS given patient's symptoms improving rapidly. No need for LP at this time.  MRI brain and L spine negative for any acute etiologies. Unclear cause but possibly pre-syncopal episode with possible  somatization vs generalized asthenia. CK mildly elevated but not clinically relevant.  -Appreciate neurology's assistance -PT/OT -C, T spine MRI if worsening  Hypertension: BP elevated overnight.. On benazepril-hctz 20-12.5 mg daily at home.  -Resume home medications  DM Type 2: On lantus 35 units qhs at home. Recently started on SSI and metformin.  -Lantus 20 units qhs -SSI-s -CBG qac/hs -hold metformin  DVT PPx: lovenox  CODE: FULL  Dispo: Disposition is deferred at this time, awaiting improvement of current medical problems.  Anticipated discharge in approximately 1 day(s).   The patient does have a current PCP (No primary care provider on file.) and does need an [facility] hospital follow-up appointment after discharge.  The patient does not have transportation limitations that hinder transportation to clinic appointments.  .Services Needed at time of discharge: Y = Yes, Blank = No PT:   OT:   RN:   Equipment:   Other:     LOS: 1 day   [facility], MD IMTS PGY-1 [phone] 10/22/2015, 1:56 PM

## 2015-10-23 LAB — GLUCOSE, CAPILLARY
Glucose-Capillary: 243 mg/dL — ABNORMAL HIGH (ref 65–99)
Glucose-Capillary: 294 mg/dL — ABNORMAL HIGH (ref 65–99)
Glucose-Capillary: 383 mg/dL — ABNORMAL HIGH (ref 65–99)
Glucose-Capillary: 157 mg/dL — ABNORMAL HIGH (ref 65–99)

## 2015-10-23 LAB — HEMOGLOBIN A1C
Hgb A1c MFr Bld: 8.1 % — ABNORMAL HIGH (ref 4.8–5.6)
MEAN PLASMA GLUCOSE: 186 mg/dL

## 2015-10-23 LAB — VITAMIN D 25 HYDROXY (VIT D DEFICIENCY, FRACTURES): Vit D, 25-Hydroxy: 38.3 ng/mL (ref 30.0–100.0)

## 2015-10-23 MED ORDER — INSULIN GLARGINE 100 UNIT/ML ~~LOC~~ SOLN
25.0000 [IU] | Freq: Every day | SUBCUTANEOUS | Status: DC
  Administered 2015-10-23: 25 [IU] via SUBCUTANEOUS
  Filled 2015-10-23 (×2): qty 0.25

## 2015-10-23 MED ORDER — INSULIN ASPART 100 UNIT/ML ~~LOC~~ SOLN
0.0000 [IU] | Freq: Three times a day (TID) | SUBCUTANEOUS | Status: DC
  Administered 2015-10-24: 11 [IU] via SUBCUTANEOUS
  Administered 2015-10-24 – 2015-10-25 (×2): 4 [IU] via SUBCUTANEOUS
  Administered 2015-10-25: 7 [IU] via SUBCUTANEOUS

## 2015-10-23 MED ORDER — SENNOSIDES-DOCUSATE SODIUM 8.6-50 MG PO TABS
1.0000 | ORAL_TABLET | Freq: Two times a day (BID) | ORAL | Status: DC
  Administered 2015-10-23 – 2015-10-25 (×4): 1 via ORAL
  Filled 2015-10-23 (×4): qty 1

## 2015-10-23 MED ORDER — INSULIN ASPART 100 UNIT/ML ~~LOC~~ SOLN: 0.0000 [IU] | Freq: Every day | SUBCUTANEOUS | Status: DC

## 2015-10-23 MED ORDER — INSULIN ASPART 100 UNIT/ML ~~LOC~~ SOLN
3.0000 [IU] | Freq: Three times a day (TID) | SUBCUTANEOUS | Status: DC
  Administered 2015-10-24 – 2015-10-25 (×5): 3 [IU] via SUBCUTANEOUS

## 2015-10-23 NOTE — Progress Notes (Signed)
Subjective: PT evaluated yesterday and recommended CIR. No events overnight. Patient reports strength is improving but not back to baseline. Having hip pain/back pain, thinks could be from lying on the hospital bed. No other issues at this time.  Objective: Vital signs in last 24 hours: Filed Vitals:   10/22/15 2209 10/22/15 2212 10/22/15 2252 10/23/15 0500  BP: 161/145 145/82  145/78  Pulse: 104 102 99 90  Temp:    98.4 F (36.9 C)  TempSrc:    Oral  Resp:   18 19  Height:      Weight:      SpO2: 100%  99% 100%   Weight change: -1 lb 1.6 oz (-0.5 kg)  Intake/Output Summary (Last 24 hours) at 10/23/15 0735 Last data filed at 10/23/15 9735  Gross per 24 hour  Intake    870 ml  Output   1850 ml  Net   -980 ml    Gen: Lying in bed, no acute distress Eyes: Anicteric sclerae CV: RRR, no m/r/g Pulm: Breathing comfortably, no audible wheezing Abd: Soft, NT, ND Ext: Thin, no edema, warm and dry Neuro: Moving all extremities, 5/5 UE, 4/5 bilateral lower. Patellar reflex 2+ on the right, mute on left, neg babinski bilateral, sensation intact to light touch throughout.  Medications: I have reviewed the patient's current medications. Scheduled Meds: . aspirin EC  81 mg Oral Daily  . atorvastatin  40 mg Oral Daily  . benazepril  20 mg Oral Daily   And  . hydrochlorothiazide  12.5 mg Oral Daily  . enoxaparin (LOVENOX) injection  40 mg Subcutaneous Q24H  . fluticasone  1 spray Each Nare Daily  . insulin aspart  0-9 Units Subcutaneous TID WC  . insulin glargine  20 Units Subcutaneous QHS  . sodium chloride flush  3 mL Intravenous Q12H   Continuous Infusions:  PRN Meds:.ALPRAZolam, ibuprofen, ipratropium-albuterol, phenol, sodium chloride flush Assessment/Plan:  Acute Bilateral Lower Extremity Weakness: Weakness improving since seen at [facility] initially. 4/5 in b/l LE. Neurology following. Do not believe this is GBS given patient's symptoms improving rapidly. No need for LP at this  time.  MRI brain and L spine negative for any acute etiologies. Unclear cause but possibly pre-syncopal episode with possible somatization vs generalized asthenia. CK mildly elevated but not clinically relevant.  -Appreciate neurology's assistance -continue to work with PT/OT. CIR consult ordered per PT rec. Will also ask SW to look for SNF placement as alternative option. -C, T spine MRI if worsening  Hypertension: On benazepril-hctz 20-12.5 mg daily at home.   - cont home medications. If BP remains elevated, may increase dose of hctz.   DM Type 2: hgba1c 8.1. On lantus 35 units qhs at home. Recently started on SSI and metformin.  -will increase lantus to 25 units since CBG remains high.  -SSI-s -CBG qac/hs -hold metformin  DVT PPx: lovenox  CODE: FULL  Dispo: Disposition is deferred at this time, awaiting improvement of current medical problems.  Anticipated discharge in approximately 1 day(s).   The patient does have a current PCP (No primary care provider on file.) and does need an [facility] hospital follow-up appointment after discharge.  The patient does not have transportation limitations that hinder transportation to clinic appointments.  .Services Needed at time of discharge: Y = Yes, Blank = No PT:   OT:   RN:   Equipment:   Other:     LOS: 2 days   Hyacinth Meeker, MD  10/23/2015, 7:35 AM

## 2015-10-23 NOTE — Progress Notes (Signed)
Physical Therapy Treatment Patient Details Name: Cynthia Fuller MRN: [id number] DOB: Feb 23, 1946 Today's Date: 10/23/2015    History of Present Illness Ms. Waugh is a 70 yo F with history of DM, HTN, sleep apnea, and RA who  presented with syncope and sudden onset lower leg weakness.MRI brain and lumbar spine negative. Neurology suspects presyncope with subsequent somatization vs generalized asthenia      PT Comments    Pt is getting up to walk with PT and noted better effort/endurance and safer mobility but overly optmistic about her tolerance.  Pt is going to be home alone and will need to be stronger for her success with walker ambulation.  Follow Up Recommendations  CIR     Equipment Recommendations  Rolling walker with 5" wheels    Recommendations for Other Services Rehab consult;OT consult     Precautions / Restrictions Precautions Precautions: Fall Restrictions Weight Bearing Restrictions: No    Mobility  Bed Mobility Overal bed mobility: Needs Assistance Bed Mobility: Supine to Sit   Sidelying to sit: Min assist;Mod assist Supine to sit: Min assist;Mod assist     General bed mobility comments: used rail and HOB elevated  Transfers Overall transfer level: Needs assistance Equipment used: Rolling walker (2 wheeled);1 person hand held assist Transfers: Sit to/from [employer] Sit to Stand: Mod assist Stand pivot transfers: Mod assist;Min assist       General transfer comment: reminders for h and placement including at recliner  Ambulation/Gait Ambulation/Gait assistance: Min assist Ambulation Distance (Feet): 60 Feet Assistive device: Rolling walker (2 wheeled);1 person hand held assist (second person for safety and chair) Gait Pattern/deviations: Step-through pattern;Wide base of support;Drifts right/left;Decreased stride length Gait velocity: reduced Gait velocity interpretation: Below normal speed for age/gender General Gait Details: Pt  asked to try to walk down entire hall and talked with her about realistic ability to do these things   Stairs            Wheelchair Mobility    Modified Rankin (Stroke Patients Only)       Balance Overall balance assessment: Needs assistance Sitting-balance support: Feet supported Sitting balance-Leahy Scale: Good   Postural control: Posterior lean Standing balance support: Bilateral upper extremity supported Standing balance-Leahy Scale: Fair Standing balance comment: fair- dynamic balance with cues for direction of walker                    Cognition Arousal/Alertness: Awake/alert Behavior During Therapy: WFL for tasks assessed/performed Overall Cognitive Status: Within Functional Limits for tasks assessed                      Exercises General Exercises - Lower Extremity Ankle Circles/Pumps: AROM;Both;5 reps Quad Sets: AROM;Both;10 reps Gluteal Sets: AROM;Both;10 reps Long Arc Quad: Strengthening;Both;10 reps Heel Slides: Strengthening;Both;10 reps Hip ABduction/ADduction: AAROM;Both;10 reps    General Comments General comments (skin integrity, edema, etc.): Pt was in need of reminders for directing walker, including using RW at door frames with reminders to avoid door casing      Pertinent Vitals/Pain Pain Assessment: No/denies pain    Home Living                      Prior Function            PT Goals (current goals can now be found in the care plan section) Acute Rehab PT Goals Patient Stated Goal: regain strength to return home alone PT Goal Formulation: With  patient Progress towards PT goals: Progressing toward goals    Frequency  Min 3X/week    PT Plan Current plan remains appropriate    Co-evaluation             End of Session Equipment Utilized During Treatment: Gait belt Activity Tolerance: Patient limited by fatigue Patient left: in chair;with call bell/phone within reach;with chair alarm set;with  family/visitor present     Time: 9643-8381 PT Time Calculation (min) (ACUTE ONLY): 26 min  Charges:  $Gait Training: 8-22 mins $Therapeutic Exercise: 8-22 mins                    G Codes:      Ivar Drape 11/09/15, 1:33 PM    Samul Dada, PT MS Acute Rehab Dept. Number: [facility] [id number] and [facility] [phone]

## 2015-10-23 NOTE — [place] FL2 (Signed)
[place] MEDICAID FL2 LEVEL OF CARE SCREENING TOOL     IDENTIFICATION  Patient Name: Cynthia Fuller Birthdate: 1946-01-15 Sex: female Admission Date (Current Location): 10/21/2015  [facility] and [place] Number:  Producer, television/film/video and Address:  [facility], [address] [place], [place] [address]      Provider Number: [id number]  Attending Physician Name and Address:  Inez Catalina, MD  Relative Name and Phone Number:  Clarisse Gouge, daughter, [phone]    Current Level of Care: Hospital Recommended Level of Care: Skilled Nursing Facility Prior Approval Number:    Date Approved/Denied:   PASRR Number: [id number]  Discharge Plan: SNF    Current Diagnoses: Patient Active Problem List   Diagnosis Date Noted  . Lower extremity weakness 10/21/2015  . 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 Hypertension 10/21/2015  . OSA (obstructive sleep apnea) 10/21/2015  . HLD (hyperlipidemia) 10/21/2015  . Rheumatoid arthritis (HCC) 10/21/2015    Orientation RESPIRATION BLADDER Height & Weight     Self, Time, Situation, Place  O2 (CPAP) Continent Weight: 87.3 kg (192 lb 7.4 oz) Height:  5\' 2"  (157.5 cm)  BEHAVIORAL SYMPTOMS/MOOD NEUROLOGICAL BOWEL NUTRITION STATUS      Continent Diet (Please see DC summary)  AMBULATORY STATUS COMMUNICATION OF NEEDS Skin   Extensive Assist Verbally Normal                       Personal Care Assistance Level of Assistance  Bathing, Feeding, Dressing Bathing Assistance: Limited assistance Feeding assistance: Independent Dressing Assistance: Limited assistance     Functional Limitations Info  Sight Sight Info: Impaired        SPECIAL CARE FACTORS FREQUENCY  PT (By licensed PT)     PT Frequency: min 3x/week              Contractures      Additional Factors Info  Code Status, Allergies, Insulin Sliding Scale Code Status Info: Full Allergies Info: Neomycin   Insulin  Sliding Scale Info: insulin aspart (novoLOG) injection 0-9 Units;insulin glargine (LANTUS) injection 25 Units       Current Medications (10/23/2015):  This is the current hospital active medication list Current Facility-Administered Medications  Medication Dose Route Frequency Provider Last Rate Last Dose  . ALPRAZolam 10/25/2015) tablet 0.5 mg  0.5 mg Oral TID PRN Prudy Feeler, MD   0.5 mg at 10/22/15 2240  . aspirin EC tablet 81 mg  81 mg Oral Daily 12/22/15, MD   81 mg at 10/22/15 1040  . atorvastatin (LIPITOR) tablet 40 mg  40 mg Oral Daily Tasrif Ahmed, MD   40 mg at 10/22/15 1721  . benazepril (LOTENSIN) tablet 20 mg  20 mg Oral Daily 12/22/15, MD   20 mg at 10/22/15 1040   And  . hydrochlorothiazide (MICROZIDE) capsule 12.5 mg  12.5 mg Oral Daily 12/22/15, MD   12.5 mg at 10/22/15 1040  . enoxaparin (LOVENOX) injection 40 mg  40 mg Subcutaneous Q24H 12/22/15, DO   40 mg at 10/22/15 2229  . fluticasone (FLONASE) 50 MCG/ACT nasal spray 1 spray  1 spray Each Nare Daily 2230, MD   1 spray at 10/22/15 1040  . ibuprofen (ADVIL,MOTRIN) tablet 600 mg  600 mg Oral Q6H PRN 12/22/15, MD   600 mg at 10/22/15 2239  . insulin aspart (novoLOG) injection 0-9 Units  0-9 Units Subcutaneous TID  WC Valentino Nose, MD   3 Units at 10/22/15 1722  . insulin glargine (LANTUS) injection 25 Units  25 Units Subcutaneous QHS Tasrif Ahmed, MD      . ipratropium-albuterol (DUONEB) 0.5-2.5 (3) MG/3ML nebulizer solution 3 mL  3 mL Nebulization Q6H PRN Inez Catalina, MD      . phenol (CHLORASEPTIC) mouth spray 1 spray  1 spray Mouth/Throat PRN Fuller Plan, MD      . sodium chloride flush (NS) 0.9 % injection 3 mL  3 mL Intravenous Q12H Yolanda Manges, DO   3 mL at 10/22/15 2231  . sodium chloride flush (NS) 0.9 % injection 3 mL  3 mL Intravenous PRN Yolanda Manges, DO         Discharge Medications: Please see discharge summary for a list of discharge  medications.  Relevant Imaging Results:  Relevant Lab Results:   Additional Information SSN: [id number] [place], [place]

## 2015-10-24 DIAGNOSIS — R29898 Other symptoms and signs involving the musculoskeletal system: Secondary | ICD-10-CM | POA: Insufficient documentation

## 2015-10-24 LAB — BASIC METABOLIC PANEL
ANION GAP: 13 (ref 5–15)
BUN: 11 mg/dL (ref 6–20)
CALCIUM: 9.2 mg/dL (ref 8.9–10.3)
CO2: 29 mmol/L (ref 22–32)
Chloride: 98 mmol/L — ABNORMAL LOW (ref 101–111)
Creatinine, Ser: 0.75 mg/dL (ref 0.44–1.00)
Glucose, Bld: 161 mg/dL — ABNORMAL HIGH (ref 65–99)
POTASSIUM: 3.9 mmol/L (ref 3.5–5.1)
SODIUM: 140 mmol/L (ref 135–145)

## 2015-10-24 LAB — GLUCOSE, CAPILLARY
GLUCOSE-CAPILLARY: 160 mg/dL — AB (ref 65–99)
GLUCOSE-CAPILLARY: 147 mg/dL — AB (ref 65–99)
Glucose-Capillary: 286 mg/dL — ABNORMAL HIGH (ref 65–99)
Glucose-Capillary: 113 mg/dL — ABNORMAL HIGH (ref 65–99)

## 2015-10-24 MED ORDER — INSULIN GLARGINE 100 UNIT/ML ~~LOC~~ SOLN
35.0000 [IU] | Freq: Every day | SUBCUTANEOUS | Status: DC
  Administered 2015-10-24: 35 [IU] via SUBCUTANEOUS
  Filled 2015-10-24 (×2): qty 0.35

## 2015-10-24 MED ORDER — ACETAMINOPHEN 325 MG PO TABS
650.0000 mg | ORAL_TABLET | Freq: Four times a day (QID) | ORAL | Status: DC | PRN
  Administered 2015-10-24 – 2015-10-25 (×4): 650 mg via ORAL
  Filled 2015-10-24 (×4): qty 2

## 2015-10-24 NOTE — Progress Notes (Signed)
Patient ID: Cynthia Fuller, female   DOB: [birth year], 70 y.o.   MRN: [id number] Medicine attending: I examined this patient today together with resident physician Dr. Hyacinth Meeker and I concur with his evaluation and management plan which we discussed together. 70 year old woman admitted on May 11 with unexplained lower extremity weakness. Normal CT brain. Degenerative changes in the lumbar spine without any cord compression or spinal stenosis. She is improving with supportive care only. We got her up and she was able to ambulate in the room with assistance. On my exam motor strength of the upper and lower extremities is 5 over 5. Reflexes absent but symmetric at the knees. 1+ symmetric at the biceps. At this point it appears that her lower extremity weakness was just part of generalized nonspecific weakness and not related to an acute or chronic neurologic problem. She was evaluated by physical and occupational therapy team. Outpatient physical therapy recommended. We will begin discharge planning.

## 2015-10-24 NOTE — Progress Notes (Signed)
   Subjective:  Patient continues to feel stronger on both legs. No events overnight. Wants to regain her strength to be able to walk her dog again. Vitals stable.    Objective: Vital signs in last 24 hours: Filed Vitals:   10/23/15 1338 10/23/15 2211 10/24/15 0008 10/24/15 0615  BP: 111/66 98/73  124/59  Pulse: 65 96 95 89  Temp: 98.7 F (37.1 C) 98.2 F (36.8 C)  98.2 F (36.8 C)  TempSrc: Oral Oral  Oral  Resp: 19 18 20 18   Height:      Weight:      SpO2: 94% 96% 98% 100%   Weight change:   Intake/Output Summary (Last 24 hours) at 10/24/15 1148 Last data filed at 10/24/15 0800  Gross per 24 hour  Intake    360 ml  Output    300 ml  Net     60 ml    Gen: sitting in bed, no acute distress Eyes: Anicteric sclerae CV: RRR, no m/r/g Pulm: Breathing comfortably, no audible wheezing Abd: Soft, NT, ND Ext: Thin, no edema, warm and dry Neuro: Moving all extremities, 5/5 UE, 4/5 bilateral lower. reflexes 2+ on upper ext, diminished bilaterally on lower exts. Able to stand with assistance today, which is different from her admission exam.   Medications: I have reviewed the patient's current medications. Scheduled Meds: . aspirin EC  81 mg Oral Daily  . atorvastatin  40 mg Oral Daily  . benazepril  20 mg Oral Daily   And  . hydrochlorothiazide  12.5 mg Oral Daily  . enoxaparin (LOVENOX) injection  40 mg Subcutaneous Q24H  . fluticasone  1 spray Each Nare Daily  . insulin aspart  0-20 Units Subcutaneous TID WC  . insulin aspart  0-5 Units Subcutaneous QHS  . insulin aspart  3 Units Subcutaneous TID WC  . insulin glargine  25 Units Subcutaneous QHS  . senna-docusate  1 tablet Oral BID  . sodium chloride flush  3 mL Intravenous Q12H   Continuous Infusions:  PRN Meds:.ALPRAZolam, ibuprofen, ipratropium-albuterol, phenol, sodium chloride flush Assessment/Plan:  Acute Bilateral Lower Extremity Weakness: significantly improving  MRI brain and L spine negative for any  acute etiologies. Unclear cause but possibly somatization vs generalized asthenia. CK mildly elevated but not clinically relevant. Less likely GBS with rapid improvement.  -Appreciate neurology's assistance. They have signed off. -continue to work with PT/OT. CIR consult ordered per PT rec. Will also ask SW to look for SNF placement as alternative option. -C, T spine MRI if worsening  Hypertension: On benazepril-hctz 20-12.5 mg daily at home.   - cont home medications. If BP remains elevated, may increase dose of hctz.   DM Type 2: hgba1c 8.1. On lantus 35 units qhs at home. Increased to home dose today. Cont SSI-R. -CBG qac/hs -hold metformin  DVT PPx: lovenox  CODE: FULL  Dispo: Disposition is deferred at this time, awaiting improvement of current medical problems.  Anticipated discharge in approximately 1 day(s).   The patient does have a current PCP (No primary care provider on file.) and does need an [facility] hospital follow-up appointment after discharge.  The patient does not have transportation limitations that hinder transportation to clinic appointments.  .Services Needed at time of discharge: Y = Yes, Blank = No PT:   OT:   RN:   Equipment:   Other:     LOS: 3 days   [facility], MD  10/24/2015, 11:48 AM

## 2015-10-25 DIAGNOSIS — Z794 Long term (current) use of insulin: Secondary | ICD-10-CM

## 2015-10-25 DIAGNOSIS — R29898 Other symptoms and signs involving the musculoskeletal system: Secondary | ICD-10-CM

## 2015-10-25 DIAGNOSIS — Z720 Tobacco use: Secondary | ICD-10-CM

## 2015-10-25 DIAGNOSIS — R269 Unspecified abnormalities of gait and mobility: Secondary | ICD-10-CM | POA: Insufficient documentation

## 2015-10-25 DIAGNOSIS — G4733 Obstructive sleep apnea (adult) (pediatric): Secondary | ICD-10-CM

## 2015-10-25 DIAGNOSIS — E119 Type 2 diabetes mellitus without complications: Secondary | ICD-10-CM

## 2015-10-25 DIAGNOSIS — M069 Rheumatoid arthritis, unspecified: Secondary | ICD-10-CM

## 2015-10-25 DIAGNOSIS — I10 Essential (primary) hypertension: Secondary | ICD-10-CM

## 2015-10-25 DIAGNOSIS — F17200 Nicotine dependence, unspecified, uncomplicated: Secondary | ICD-10-CM | POA: Insufficient documentation

## 2015-10-25 LAB — GLUCOSE, CAPILLARY
GLUCOSE-CAPILLARY: 171 mg/dL — AB (ref 65–99)
GLUCOSE-CAPILLARY: 194 mg/dL — AB (ref 65–99)

## 2015-10-25 MED ORDER — PHENOL 1.4 % MT LIQD: 1.0000 | OROMUCOSAL | Status: DC | PRN

## 2015-10-25 NOTE — Progress Notes (Signed)
  Date: 10/25/2015  Patient name: Cynthia Fuller  Medical record number: [id number]  Date of birth: 1945/12/25   This patient has been seen and the plan of care was discussed with the house staff. Please see Dr. Bonney Roussel note for complete details. I concur with his findings.  Inez Catalina, MD 10/25/2015, 11:35 AM

## 2015-10-25 NOTE — Progress Notes (Signed)
Report given to Eloisa Northern , Nurse at [facility]

## 2015-10-25 NOTE — Progress Notes (Signed)
Physical Therapy Treatment Patient Details Name: Cynthia Fuller MRN: [id number] DOB: 04/25/1946 Today's Date: 10/25/2015    History of Present Illness Ms. Bagheri is a 70 yo F with history of DM, HTN, sleep apnea, and RA who  presented with syncope and sudden onset lower leg weakness.MRI brain and lumbar spine negative. Neurology suspects presyncope with subsequent somatization vs generalized asthenia      PT Comments    Determined to regain her independence. Eager to work with PT. Able to ambulate much better today  Follow Up Recommendations  CIR     Equipment Recommendations  Rolling walker with 5" wheels    Recommendations for Other Services Rehab consult;OT consult     Precautions / Restrictions Precautions Precautions: Fall Restrictions Weight Bearing Restrictions: No    Mobility  Bed Mobility Overal bed mobility: Needs Assistance Bed Mobility: Sidelying to Sit   Sidelying to sit: Min assist       General bed mobility comments: used rail and HOB flat;   Transfers Overall transfer level: Needs assistance Equipment used: Rolling walker (2 wheeled);1 person hand held assist Transfers: Sit to/from Stand Sit to Stand: Mod assist         General transfer comment: reminders for hand placement including at recliner  Ambulation/Gait Ambulation/Gait assistance: Min assist Ambulation Distance (Feet): 150 Feet Assistive device: Rolling walker (2 wheeled) Gait Pattern/deviations: Step-through pattern;Decreased stride length;Trunk flexed Gait velocity: reduced       Stairs            Wheelchair Mobility    Modified Rankin (Stroke Patients Only)       Balance Overall balance assessment: Needs assistance Sitting-balance support: Feet supported Sitting balance-Leahy Scale: Good     Standing balance support: No upper extremity supported Standing balance-Leahy Scale: Poor Standing balance comment:  (unsteady with sway upon standing each time)                    Cognition Arousal/Alertness: Awake/alert Behavior During Therapy: WFL for tasks assessed/performed Overall Cognitive Status: Within Functional Limits for tasks assessed                      Exercises General Exercises - Lower Extremity Ankle Circles/Pumps: AROM;Both;5 reps Quad Sets: AROM;Both;10 reps Long Arc Quad: Strengthening;Both;5 reps Heel Slides: Strengthening;Both;5 reps Hip ABduction/ADduction: AAROM;Both;10 reps    General Comments        Pertinent Vitals/Pain Pain Assessment: No/denies pain    Home Living                      Prior Function            PT Goals (current goals can now be found in the care plan section) Acute Rehab PT Goals Patient Stated Goal: regain strength to return home alone Time For Goal Achievement: 10/29/15 Progress towards PT goals: Progressing toward goals    Frequency  Min 3X/week    PT Plan Current plan remains appropriate    Co-evaluation             End of Session Equipment Utilized During Treatment: Gait belt Activity Tolerance: Patient limited by fatigue Patient left: in chair;with call bell/phone within reach;with chair alarm set     Time: 9381-0175 PT Time Calculation (min) (ACUTE ONLY): 23 min  Charges:  $Gait Training: 8-22 mins $Therapeutic Exercise: 8-22 mins  G Codes:      Toniya Rozar 10/25/2015, 3:02 PM  Pager [phone]

## 2015-10-25 NOTE — Progress Notes (Signed)
[facility] Founds to be D/C'd Skilled nursing facility per MD order.  Discussed with the patient and all questions fully answered.  VSS, Skin clean, dry and intact without evidence of skin break down, no evidence of skin tears noted. IV catheter discontinued intact. Site without signs and symptoms of complications. Dressing and pressure applied.  An After Visit Summary was printed and given to the patient. Patient received prescription.  D/c education completed with patient/family including follow up instructions, medication list, d/c activities limitations if indicated, with other d/c instructions as indicated by MD - patient able to verbalize understanding, all questions fully answered.   Patient instructed to return to ED, call 911, or call MD for any changes in condition.   Patient escorted via WC, and D/C SNF via private auto.  L'ESPERANCE, Sawsan Riggio C 10/25/2015 3:55 PM

## 2015-10-25 NOTE — Consult Note (Signed)
Physical Medicine and Rehabilitation Consult Reason for Consult: Presyncope/somatization Referring Physician: Internal medicine   HPI: Cynthia Fuller is a 70 y.o. right handed female with history of diabetes mellitus, tobacco abuse, hypertension, OSA, rheumatoid arthritis. Per chart review patient lives alone and was independent prior to admission. One level home with 3 steps to entry. Local family in the area works. Presented 10/21/2015 with acute onset bilateral lower extremity weakness, dizziness and mild nausea with some urinary incontinence. No reports of fall or trauma. MRI of the brain negative for acute changes. MRI lumbar spine unremarkable. CK mildly elevated 260.Troponin negative. Urine study negative. Neurology consulted suspect pre-syncope with subsequent somatizations versus generalized asthenia. No plan for further workup. Patient seen ambulating independently with a rolling walker from bathroom to her bed. Physical therapy evaluation completed with recommendations of physical medicine rehabilitation consult.   Review of Systems  Constitutional: Positive for malaise/fatigue. Negative for fever and chills.  HENT: Negative for hearing loss.   Eyes: Negative for blurred vision and double vision.  Respiratory: Negative for cough and shortness of breath.   Cardiovascular: Negative for chest pain, palpitations and leg swelling.  Gastrointestinal: Positive for constipation. Negative for abdominal pain.  Genitourinary: Negative for dysuria and hematuria.       Intermittent bouts of urinary incontinence  Skin: Negative for rash.  Neurological: Positive for dizziness, weakness and headaches. Negative for seizures and loss of consciousness.  Psychiatric/Behavioral:       Anxiety  All other systems reviewed and are negative.  Past Medical History  Diagnosis Date  . Diabetes mellitus without complication (HCC)   . Hypertension   . Sleep apnea   . Hypercholesteremia   .  Rheumatoid arthritis [facility])    Past Surgical History  Procedure Laterality Date  . Ablation     Family History  Problem Relation Age of Onset  . Diabetes Brother   . Kidney disease Brother    Social History:  reports that she has been smoking Cigarettes.  She quit smokeless tobacco use 5 days ago. Her alcohol and drug histories are not on file. Allergies:  Allergies  Allergen Reactions  . Neomycin Hives   Medications Prior to Admission  Medication Sig Dispense Refill  . ALPRAZolam (XANAX) 0.5 MG tablet Take 0.5 mg by mouth 3 (three) times daily as needed for anxiety.    [name] aspirin EC 81 MG tablet Take 81 mg by mouth daily.    [name] atorvastatin (LIPITOR) 40 MG tablet Take 40 mg by mouth daily.    . benazepril-hydrochlorthiazide (LOTENSIN HCT) 20-12.5 MG tablet Take 1 tablet by mouth daily.    . fluticasone (FLONASE) 50 MCG/ACT nasal spray Place 1 spray into both nostrils daily.    [name] ibuprofen (ADVIL,MOTRIN) 200 MG tablet Take 400 mg by mouth every 6 (six) hours as needed (pain).    . insulin aspart (NOVOLOG) 100 UNIT/ML injection Inject 0-8 Units into the skin 3 (three) times daily before meals. Sliding scale    . insulin glargine (LANTUS) 100 UNIT/ML injection Inject 35 Units into the skin at bedtime.    . Ipratropium-Albuterol (COMBIVENT RESPIMAT) 20-100 MCG/ACT AERS respimat Inhale 1 puff into the lungs every 6 (six) hours as needed for wheezing.    . metFORMIN (GLUCOPHAGE-XR) 500 MG 24 hr tablet Take 500 mg by mouth daily with breakfast.    . predniSONE (DELTASONE) 5 MG tablet Take 10 mg by mouth daily.      Home: Home Living Family/patient expects  to be discharged to:: Private residence Living Arrangements: Alone Available Help at Discharge: Family, Available PRN/intermittently Type of Home: House Home Access: Stairs to enter [employer] of Steps: 3 Entrance Stairs-Rails: Right, Left, Can reach both Home Layout: One level Bathroom Shower/Tub: Tub/shower unit Home  Equipment: Cane - quad  Functional History: Prior Function Level of Independence: Independent with assistive device(s) Comments: reports she drives, grocery shops (holding cart); rarely uses quad cane (if not feeling well) Functional Status:  Mobility: Bed Mobility Overal bed mobility: Needs Assistance Bed Mobility: Supine to Sit Sidelying to sit: Min assist, Mod assist Supine to sit: Min assist, Mod assist General bed mobility comments: used rail and HOB elevated Transfers Overall transfer level: Needs assistance Equipment used: Rolling walker (2 wheeled), 1 person hand held assist Transfers: Sit to/from Stand, [employer] Transfers Sit to Stand: Mod assist Stand pivot transfers: Mod assist, Min assist General transfer comment: reminders for h and placement including at recliner Ambulation/Gait Ambulation/Gait assistance: Min assist Ambulation Distance (Feet): 60 Feet Assistive device: Rolling walker (2 wheeled), 1 person hand held assist (second person for safety and chair) Gait Pattern/deviations: Step-through pattern, Wide base of support, Drifts right/left, Decreased stride length General Gait Details: Pt asked to try to walk down entire hall and talked with her about realistic ability to do these things Gait velocity: reduced Gait velocity interpretation: Below normal speed for age/gender    ADL:    Cognition: Cognition Overall Cognitive Status: Within Functional Limits for tasks assessed Orientation Level: Oriented X4 Cognition Arousal/Alertness: Awake/alert Behavior During Therapy: WFL for tasks assessed/performed Overall Cognitive Status: Within Functional Limits for tasks assessed  Blood pressure 121/67, pulse 89, temperature 98 F (36.7 C), temperature source Oral, resp. rate 18, height 5\' 2"  (1.575 m), weight 87.3 kg (192 lb 7.4 oz), SpO2 95 %. Physical Exam  Vitals reviewed. Constitutional: She is oriented to person, place, and time. She appears  well-developed and well-nourished.  HENT:  Head: Normocephalic and atraumatic.  Eyes: Conjunctivae and EOM are normal.  Neck: Normal range of motion. Neck supple. No thyromegaly present.  Cardiovascular: Normal rate and regular rhythm.   Murmur heard. Respiratory: Effort normal and breath sounds normal. No respiratory distress.  GI: Soft. Bowel sounds are normal. She exhibits no distension.  Musculoskeletal: She exhibits no edema or tenderness.  Neurological: She is alert and oriented to person, place, and time.  Follows full commands Motor: Bilateral upper extremities 4+/5 proximal to distal  Right lower extremity: Hip flexion, knee extension 4/5, ankle dorsi/plantar flexion 4+/5 Left lower cervical and hip flexion, knee extension 3+/5, ankle dorsi/plantar flexion 4 -/5 Sensation intact light touch Dysarthric speech + Tremor  Skin: Skin is warm and dry.  Psychiatric: She has a normal mood and affect. Her behavior is normal.    Results for orders placed or performed during the hospital encounter of 10/21/15 (from the past 24 hour(s))  Glucose, capillary     Status: Abnormal   Collection Time: 10/24/15  8:32 AM  Result Value Ref Range   Glucose-Capillary 160 (H) 65 - 99 mg/dL  Basic metabolic panel     Status: Abnormal   Collection Time: 10/24/15  8:58 AM  Result Value Ref Range   Sodium 140 135 - 145 mmol/L   Potassium 3.9 3.5 - 5.1 mmol/L   Chloride 98 (L) 101 - 111 mmol/L   CO2 29 22 - 32 mmol/L   Glucose, Bld 161 (H) 65 - 99 mg/dL   BUN 11 6 - 20  mg/dL   Creatinine, Ser 6.78 0.44 - 1.00 mg/dL   Calcium 9.2 8.9 - 93.8 mg/dL   GFR calc non Af Amer >60 >60 mL/min   GFR calc Af Amer >60 >60 mL/min   Anion gap 13 5 - 15  Glucose, capillary     Status: Abnormal   Collection Time: 10/24/15 12:50 PM  Result Value Ref Range   Glucose-Capillary 286 (H) 65 - 99 mg/dL  Glucose, capillary     Status: Abnormal   Collection Time: 10/24/15  5:02 PM  Result Value Ref Range    Glucose-Capillary 113 (H) 65 - 99 mg/dL  Glucose, capillary     Status: Abnormal   Collection Time: 10/24/15 10:07 PM  Result Value Ref Range   Glucose-Capillary 147 (H) 65 - 99 mg/dL   No results found.  Assessment/Plan: Diagnosis: Presyncope/somatization Labs and images independently reviewed.  Records reviewed and summated above.  1. Does the need for close, 24 hr/day medical supervision in concert with the patient's rehab needs make it unreasonable for this patient to be served in a less intensive setting? No  2. Co-Morbidities requiring supervision/potential complications: diabetes mellitus (Monitor in accordance with exercise and adjust meds as necessary), HTN (monitor and provide prns in accordance with increased physical exertion and pain), OSA (continue CPAP, monitor for signs and symptoms of daytime somnolence, and chair appropriate energy for therapies), rheumatoid arthritis (monitor for flares, ensure pain does not limit progress in therapies), tobacco abuse (continue counsel) 3. Due to safety, disease management, medication administration and patient education, does the patient require 24 hr/day rehab nursing? Yes 4. Does the patient require coordinated care of a physician, rehab nurse, PT (1-2 hrs/day, 5 days/week), OT (1-2 hrs/day, 5 days/week) and SLP (1-2 hrs/day, 5 days/week) to address physical and functional deficits in the context of the above medical diagnosis(es)? Potentially Addressing deficits in the following areas: endurance, locomotion, transferring, toileting, speech and psychosocial support 5. Can the patient actively participate in an intensive therapy program of at least 3 hrs of therapy per day at least 5 days per week? Yes 6. The potential for patient to make measurable gains while on inpatient rehab is NA 7. Anticipated functional outcomes upon discharge from inpatient rehab are n/a  with PT, n/a with OT, n/a with SLP. 8. Estimated rehab length of stay to reach  the above functional goals is: NA 9. Does the patient have adequate social supports and living environment to accommodate these discharge functional goals? N/A 10. Anticipated D/C setting: Other 11. Anticipated post D/C treatments: SNF 12. Overall Rehab/Functional Prognosis: good  RECOMMENDATIONS: This patient's condition is appropriate for continued rehabilitative care in the following setting: Anticipate patient has made functional gains since last physical therapy evaluation as patient was seen ambulating independently from the bathroom to her bed with a rolling walker. Further, patient does not have a diagnosis that would be amenable to CIR. If family is unable to provide support and patient continues to have functional deficits would recommend SNF. Patient has agreed to participate in recommended program. Potentially Note that insurance prior authorization may be required for reimbursement for recommended care.  Comment: Rehab Admissions Coordinator to follow up.  Maryla Morrow, MD 10/25/2015

## 2015-10-25 NOTE — Discharge Summary (Signed)
Name: Cynthia Fuller MRN: [id number] DOB: [birth year] 70 y.o. PCP: No primary care provider on file.  Date of Admission: 10/21/2015  1:13 AM Date of Discharge: 10/25/2015 Attending Physician: Inez Catalina, MD  Discharge Diagnosis: Principal Problem:   Lower extremity weakness Active Problems:   Diabetes mellitus type 2, insulin dependent (HCC)   Hypertension   OSA (obstructive sleep apnea)   HLD (hyperlipidemia)   Rheumatoid arthritis (HCC)   Leg weakness  Discharge Medications:   Medication List    ASK your doctor about these medications        ALPRAZolam 0.5 MG tablet  Commonly known as:  XANAX  Take 0.5 mg by mouth 3 (three) times daily as needed for anxiety.     aspirin EC 81 MG tablet  Take 81 mg by mouth daily.     atorvastatin 40 MG tablet  Commonly known as:  LIPITOR  Take 40 mg by mouth daily.     benazepril-hydrochlorthiazide 20-12.5 MG tablet  Commonly known as:  LOTENSIN HCT  Take 1 tablet by mouth daily.     COMBIVENT RESPIMAT 20-100 MCG/ACT Aers respimat  Generic drug:  Ipratropium-Albuterol  Inhale 1 puff into the lungs every 6 (six) hours as needed for wheezing.     fluticasone 50 MCG/ACT nasal spray  Commonly known as:  FLONASE  Place 1 spray into both nostrils daily.     ibuprofen 200 MG tablet  Commonly known as:  ADVIL,MOTRIN  Take 400 mg by mouth every 6 (six) hours as needed (pain).     insulin aspart 100 UNIT/ML injection  Commonly known as:  novoLOG  Inject 0-8 Units into the skin 3 (three) times daily before meals. Sliding scale     insulin glargine 100 UNIT/ML injection  Commonly known as:  LANTUS  Inject 35 Units into the skin at bedtime.     metFORMIN 500 MG 24 hr tablet  Commonly known as:  GLUCOPHAGE-XR  Take 500 mg by mouth daily with breakfast.     predniSONE 5 MG tablet  Commonly known as:  DELTASONE  Take 10 mg by mouth daily.        Disposition and follow-up:   [name] was discharged from [facility] in Stable condition.  At the hospital follow up visit please address:  1. Acute Bilateral Leg Weakness - Unclear etiology. Improved quickly and had 5/5 strength in bilateral lower extremities at discharge. Significant social stressors that may be contributing. If symptoms worsening, can consider C-spine and T-spine MRI.  DM - stopped home dose metformin as patient reports side effects from the medication  2.  Labs / imaging needed at time of follow-up: None  3.  Pending labs/ test needing follow-up: None  Follow-up Appointments:   Discharge Instructions:    Consultations:    Procedures Performed:  Dg Lumbar Spine Complete  10/21/2015  CLINICAL DATA:  Larey Seat this morning.  Low back pain. EXAM: LUMBAR SPINE - COMPLETE 4+ VIEW COMPARISON:  None. FINDINGS: There is no evidence of lumbar spine fracture. Alignment is normal. Intervertebral disc spaces are maintained. IMPRESSION: Negative. Electronically Signed   By: Ellery Plunk M.D.   On: 10/21/2015 04:22   Ct Head Wo Contrast  10/21/2015  CLINICAL DATA:  70 year old female with fall and bilateral leg weakness. EXAM: CT HEAD WITHOUT CONTRAST TECHNIQUE: Contiguous axial images were obtained from the base of the skull through the vertex without intravenous contrast. COMPARISON:  None. FINDINGS: The ventricles and  sulci are appropriate in size for patient's age. Minimal periventricular and deep white matter chronic microvascular ischemic changes noted. There is no acute intracranial hemorrhage. No mass effect or midline shift. The visualized paranasal sinuses and mastoid air cells are clear. The calvarium is intact. IMPRESSION: No acute intracranial pathology. Electronically Signed   By: Elgie Collard M.D.   On: 10/21/2015 04:05   Mr Cynthia Fuller Head Wo Contrast  10/22/2015  CLINICAL DATA:  70 year old female with acute onset bilateral lower extremity weakness, lightheadedness. Negative brain diffusion imaging yesterday. Initial  encounter. EXAM: MRA HEAD WITHOUT CONTRAST TECHNIQUE: Angiographic images of the Circle of Willis were obtained using MRA technique without intravenous contrast. COMPARISON:  Brain diffusion-weighted imaging 10/21/2015. Noncontrast head CT 10/21/2015. FINDINGS: No intracranial mass effect or ventriculomegaly. Antegrade flow in the posterior circulation with fairly codominant distal vertebral arteries. No distal vertebral stenosis. Mild MOTSA artifact along the proximal basilar artery which otherwise appears normal. Normal SCA and PCA origins. Both posterior communicating arteries are present in her mildly tortuous. Normal PCA branches. Antegrade flow in the distal cervical ICAs and both ICA siphons. Evidence of calcified siphon atherosclerosis, but no siphon stenosis. Ophthalmic and posterior communicating artery origins are normal. Carotid termini are normal. MCA and ACA origins are normal. Mildly tortuous A1 segments. Anterior communicating artery and visualized bilateral ACA branches are within normal limits. Mildly tortuous MCA M1 segments. Bilateral MCA bifurcations and visualized bilateral MCA branches are within normal limits. IMPRESSION: Negative intracranial MRA. Electronically Signed   By: Odessa Fleming M.D.   On: 10/22/2015 09:52   Mr Brain Wo Contrast  10/21/2015  CLINICAL DATA:  Acute onset bilateral lower extremity weakness and lightheadedness after walking into kitchen. Associated nausea and urinary incontinence. History of hypertension, rheumatoid arthritis and diabetes. EXAM: MRI HEAD WITHOUT CONTRAST TECHNIQUE: Axial diffusion weighted imaging obtained on a 1.5 tesla scanner. Patient was unable to tolerate further imaging due to claustrophobia and anxiety and examination was prematurely terminated. COMPARISON:  CT HEAD Oct 21, 2015 at 0355 hours FINDINGS: No reduced diffusion to suggest acute ischemia. No signal abnormality to suggest hemorrhage. Ventricles and sulci are normal for a patient's age. No  midline shift, mass effect. IMPRESSION: Limited single sequence MRI of the brain: No acute ischemia. Patient is scheduled to return tomorrow to complete exam. Electronically Signed   By: Awilda Metro M.D.   On: 10/21/2015 22:03   Mr Lumbar Spine W Wo Contrast  10/21/2015  CLINICAL DATA:  70 year old female status post fall. Nausea. Hyperglycemia. Back pain. Weakness. Initial encounter. EXAM: MRI LUMBAR SPINE WITHOUT AND WITH CONTRAST TECHNIQUE: Multiplanar and multiecho pulse sequences of the lumbar spine were obtained without and with intravenous contrast. CONTRAST:  74mL MULTIHANCE GADOBENATE DIMEGLUMINE 529 MG/ML IV SOLN COMPARISON:  Lumbar spine radiographs 0404 hours today. Chest CTA 08/16/2006. FINDINGS: Correlation of the chest CTA with today lumbar radiographs confirms the lowest full size ribs are T11, with hypoplastic or absent ribs at T12. Subsequently the L5 level is partially or mostly sacralized. Correlation with radiographs is recommended prior to any operative intervention. Trace anterolisthesis at L3-L4 and L4-L5. Otherwise normal vertebral height and alignment. Overall normal bone marrow signal. No marrow edema or evidence of acute osseous abnormality. Motion artifact on post-contrast imaging. Visualized lower thoracic spinal cord is normal with conus medularis at L1. No abnormal intradural enhancement. Negative visualized abdominal viscera. Partially visible mild bladder distension. Posterior paraspinal soft tissues remarkable for mild subcutaneous edema and patchy bilateral abnormal T2 and STIR hyperintensity in  the erector spinae muscles (thoracolumbar junction level on the left series 6, image 12 and lower lumbar spine on the right series 6, image 1). Associated mild patchy enhancement of the muscles. No associated fluid collection. T9-T10:  Negative. T10-T11: Negative. T11-T12:  Negative. T12-L1:  Negative. L1-L2:  Mild facet and ligament flavum hypertrophy. L2-L3: Minimal disc bulge.  Mild facet and ligament flavum hypertrophy. L3-L4: Trace anterolisthesis. Mild circumferential disc bulge. Moderate facet hypertrophy. Mild L3 foraminal stenosis. L4-L5: Trace anterolisthesis. Circumferential disc bulge. Small central posterior annular fissure. Mild to moderate facet hypertrophy greater on the right. Mild right L4 foraminal stenosis. L5-S1:  Largely sacralized and negative. IMPRESSION: 1. No acute osseous abnormality in the lumbar spine. Transitional anatomy. 2. Occasional abnormal signal in the bilateral posterior paraspinal muscles. Legrand Rams this is posttraumatic muscle contusion or myositis given the scattered and patchy morphology. No associated fluid collection. 3. Mild degenerative spondylolisthesis at L3-L4 and L4-L5 with associated mild disc and moderate facet degeneration. No lumbar spinal or lateral recess stenosis. There is mild L3 and L4 foraminal stenosis. Electronically Signed   By: Odessa Fleming M.D.   On: 10/21/2015 07:49   Dg Abd Acute W/chest  10/21/2015  CLINICAL DATA:  70 year old female with nausea vomiting abdominal pain EXAM: DG ABDOMEN ACUTE W/ 1V CHEST COMPARISON:  Chest radiograph dated 06/06/2010 FINDINGS: Single-view of the chest does not demonstrate a focal consolidation. There is mild increased vascular prominence concerning for mild congestion. There is no pleural effusion or pneumothorax. Stable cardiac silhouette. Moderate stool noted throughout the colon and within the rectum with possible fecal impaction. There is no evidence of bowel obstruction. No free air. No radiopaque calculi or foreign object. There is degenerative changes of the spine. No acute fracture. IMPRESSION: Mild congestion.  No focal consolidation. Constipation.  No bowel obstruction. Electronically Signed   By: Elgie Collard M.D.   On: 10/21/2015 02:50   Admission HPI: Ms. Nass is a 70 year old female with a past medical history of DM type 2, HTN, HLD, OSA who presents with acute onset bilateral  lower extremity weakness. Patient reports that she has recently moved to [facility] and was started on atorvastatin in the past few weeks and metformin a week ago. Reports since starting metformin she has felt nauseated with generalized weakness and fatigue. No emesis. Last night, she became acutely weak in her b/l lower extremities. She denies any focal weakness prior to this and had been walking normally last night. Denies any falls. Reports having her friend help her to the couch and being unable to move either of her legs. Does report she checked her CBG when this happened and it was 190. Denies any trauma. Did report some back pain that occurred after lying down on the couch in an uncomfortable position. Reports she has back pain whenever lying on her back. Reported to the ED that she has baseline incontinence but reports to me she has no bowel or bladder incontinence.   She denies any recent illness. No fevers, chills, shortness of breath, chest pain, vision disturbances. No loss of bowel or bladder function. Does report a sick contact at home. Patient's friend staying at her house has URI symptoms. Patient denies any cough, rhinorrhea, diarrhea, abdominal pain, headaches. Does report a sore throat. States she has had it for months. Resolved a few days ago with prednisone.   She initially agreed to LP in the ED. She now is refusing the procedure. Discussed the risks and benefits at length. Reported  she would think about it.   Hospital Course by problem list:   Acute Bilateral Lower Extremity Weakness: Patient with sudden onset bilateral leg weakness with associated lightheadedness. Initially presented with profound leg weakness in the ED. Concerns for GBS initially. However, patient's weakness improved considerably within several hours. CT head in the ED was negative for any acute findings. MRI lumbar spine showed no explanation for her symptoms. Neurology was consulted. MRI/MRA was done which were  unremarkable. She continued to improve and was able to walk with assistance. Unclear etiology. Cannot rule out TIA. More history revealed significant social stressors that may have contributed. PT recommended SNF. Will need follow up outpatient with PCP. If symptoms return/do not improve can consider C-spine and T-spine MRI.   Hypertension: BP elevated on initial presentation. Improved after restarting her home benazepril-hctz 20-12.5 mg daily. Well controlled during hospitalization. Continue home meds.   DM Type 2: Hgba1c 8.1. On lantus 35 units qhs at home. Recently started on metformin. She reports not feeling well while taking metformin. Stopped metformin at discharge. Follow up with PCP.  HLD: Recetnly started on atorvastatin. Held home meds during hospitalization. Resumed at discharge as patient's symptoms not consistent with known statin side effects. Follow up with PCP.   OSA: CPAP qhs  Rheumatoid Arthritis: Takes prednisone at home. Unclear how often. Held prednisone during hospitalization.   Discharge Vitals:   BP 121/67 mmHg  Pulse 89  Temp(Src) 98 F (36.7 C) (Oral)  Resp 18  Ht 5\' 2"  (1.575 m)  Wt 192 lb 7.4 oz (87.3 kg)  BMI 35.19 kg/m2  SpO2 95%  Discharge Labs:  Results for orders placed or performed during the hospital encounter of 10/21/15 (from the past 24 hour(s))  Glucose, capillary     Status: Abnormal   Collection Time: 10/24/15  8:32 AM  Result Value Ref Range   Glucose-Capillary 160 (H) 65 - 99 mg/dL  Basic metabolic panel     Status: Abnormal   Collection Time: 10/24/15  8:58 AM  Result Value Ref Range   Sodium 140 135 - 145 mmol/L   Potassium 3.9 3.5 - 5.1 mmol/L   Chloride 98 (L) 101 - 111 mmol/L   CO2 29 22 - 32 mmol/L   Glucose, Bld 161 (H) 65 - 99 mg/dL   BUN 11 6 - 20 mg/dL   Creatinine, Ser 8.29 0.44 - 1.00 mg/dL   Calcium 9.2 8.9 - 93.7 mg/dL   GFR calc non Af Amer >60 >60 mL/min   GFR calc Af Amer >60 >60 mL/min   Anion gap 13 5 - 15    Glucose, capillary     Status: Abnormal   Collection Time: 10/24/15 12:50 PM  Result Value Ref Range   Glucose-Capillary 286 (H) 65 - 99 mg/dL  Glucose, capillary     Status: Abnormal   Collection Time: 10/24/15  5:02 PM  Result Value Ref Range   Glucose-Capillary 113 (H) 65 - 99 mg/dL  Glucose, capillary     Status: Abnormal   Collection Time: 10/24/15 10:07 PM  Result Value Ref Range   Glucose-Capillary 147 (H) 65 - 99 mg/dL    Signed: Valentino Nose, MD 10/25/2015, 7:52 AM

## 2015-10-25 NOTE — Care Management Important Message (Signed)
Important Message  Patient Details  Name: Cynthia Fuller MRN: [id number] Date of Birth: 1946/02/10   Medicare Important Message Given:  Yes    Lawerance Sabal, RN 10/25/2015, 2:01 PMImportant Message  Patient Details  Name: Cynthia Fuller MRN: [id number] Date of Birth: [birth year]   Medicare Important Message Given:  Yes    Lawerance Sabal, RN 10/25/2015, 2:01 PM

## 2015-10-25 NOTE — Progress Notes (Signed)
CSW spoke with pt and pt dtr at bedside concerning CIR vs SNF- pt would like [facility] and can't be admitted to CIR.  CSW spoke with CIR coordinator who confirmed pt not able to go to CIR.  Patient will discharge to [facility] Anticipated discharge date:5/15 Family notified: pt dtr Transportation by pt dtr- picking up around 3:45pm  CSW signing off.  Merlyn Lot, LCSWA Clinical Social Worker [phone]

## 2015-10-25 NOTE — Progress Notes (Signed)
I spoke with SW and discussed that pt does not have a diagnosis for admit to inpt rehab [facility] or SNF recommended. [phone]

## 2015-10-25 NOTE — Care Management Note (Signed)
Case Management Note  Patient Details  Name: Cynthia Fuller MRN: [id number] Date of Birth: May 23, 1946  Subjective/Objective:                 Patient admitted with weakness.    Action/Plan:  Will DC to SNF as facilitated by CSW.  Expected Discharge Date:                  Expected Discharge Plan:  Skilled Nursing Facility  In-House Referral:  Clinical Social Work  Discharge planning Services     Post Acute Care Choice:  NA Choice offered to:     DME Arranged:    DME Agency:     HH Arranged:    HH Agency:     Status of Service:  Completed, signed off  Medicare Important Message Given:  Yes Date Medicare IM Given:    Medicare IM give by:    Date Additional Medicare IM Given:    Additional Medicare Important Message give by:     If discussed at Long Length of Stay Meetings, dates discussed:    Additional Comments:  Lawerance Sabal, RN 10/25/2015, 2:04 PM

## 2015-10-25 NOTE — Progress Notes (Signed)
   Subjective: Patient continues to feel stronger on both legs. No events overnight. Vitals stable.   Objective: Vital signs in last 24 hours: Filed Vitals:   10/24/15 0615 10/24/15 2219 10/25/15 0044 10/25/15 0548  BP: 124/59 106/57  121/67  Pulse: 89 85 84 89  Temp: 98.2 F (36.8 C)   98 F (36.7 C)  TempSrc: Oral   Oral  Resp: 18 18 15 18   Height:      Weight:      SpO2: 100% 96% 95%    Weight change:   Intake/Output Summary (Last 24 hours) at 10/25/15 0752 Last data filed at 10/24/15 1529  Gross per 24 hour  Intake    240 ml  Output    550 ml  Net   -310 ml    Gen: lying in bed, no acute distress Eyes: Anicteric sclerae CV: RRR, no m/r/g Pulm: Breathing comfortably, no audible wheezing Abd: Soft, NT, ND Ext: Thin, no edema, warm and dry Neuro: Moving all extremities, 5/5 UE, 5/5 RLE, 5 minus strength in LLE. reflexes 2+ on upper ext, diminished bilaterally on lower exts.  Medications: I have reviewed the patient's current medications. Scheduled Meds: . aspirin EC  81 mg Oral Daily  . atorvastatin  40 mg Oral Daily  . benazepril  20 mg Oral Daily   And  . hydrochlorothiazide  12.5 mg Oral Daily  . enoxaparin (LOVENOX) injection  40 mg Subcutaneous Q24H  . fluticasone  1 spray Each Nare Daily  . insulin aspart  0-20 Units Subcutaneous TID WC  . insulin aspart  0-5 Units Subcutaneous QHS  . insulin aspart  3 Units Subcutaneous TID WC  . insulin glargine  35 Units Subcutaneous QHS  . senna-docusate  1 tablet Oral BID  . sodium chloride flush  3 mL Intravenous Q12H   Continuous Infusions:  PRN Meds:.acetaminophen, ALPRAZolam, ibuprofen, ipratropium-albuterol, phenol, sodium chloride flush Assessment/Plan:  Acute Bilateral Lower Extremity Weakness: continues to improve. Cannot rule out TIA. Patient reports significant stressful discussion with family prior to episode. States her son was killed in 2008 and his birthday and her birthday are upcoming. Had an  argument with family about having a party and became tearful. Likely some component of life stressors playing a role in her symptoms.  -Appreciate neurology's assistance. They have signed off. -continue to work with PT/OT. CIR consult ordered per PT rec. Will also ask SW to look for SNF placement as alternative option. -C, T spine MRI if worsening  Hypertension: On benazepril-hctz 20-12.5 mg daily at home. BP well controlled.  -cont home medications.  DM Type 2: hgba1c 8.1. On lantus 35 units qhs at home.  -Lantus 35 units qhs -Cont SSI-R. -CBG qac/hs -hold metformin  DVT PPx: lovenox  CODE: FULL  Dispo: Discharge to CIR vs SNF pending placement.   The patient does have a current PCP (No primary care provider on file.) and does need an [facility] hospital follow-up appointment after discharge.  The patient does not have transportation limitations that hinder transportation to clinic appointments.  .Services Needed at time of discharge: Y = Yes, Blank = No PT:   OT:   RN:   Equipment:   Other:     LOS: 4 days   [facility], MD  10/25/2015, 7:52 AM

## 2015-10-26 ENCOUNTER — Encounter: Payer: Self-pay | Admitting: Adult Health

## 2015-10-26 ENCOUNTER — Non-Acute Institutional Stay (SKILLED_NURSING_FACILITY): Payer: Medicare Other | Admitting: Adult Health

## 2015-10-26 DIAGNOSIS — Z794 Long term (current) use of insulin: Secondary | ICD-10-CM | POA: Diagnosis not present

## 2015-10-26 DIAGNOSIS — I10 Essential (primary) hypertension: Secondary | ICD-10-CM | POA: Diagnosis not present

## 2015-10-26 DIAGNOSIS — E1169 Type 2 diabetes mellitus with other specified complication: Secondary | ICD-10-CM | POA: Diagnosis not present

## 2015-10-26 DIAGNOSIS — M069 Rheumatoid arthritis, unspecified: Secondary | ICD-10-CM

## 2015-10-26 DIAGNOSIS — R29898 Other symptoms and signs involving the musculoskeletal system: Secondary | ICD-10-CM | POA: Diagnosis not present

## 2015-10-26 DIAGNOSIS — E785 Hyperlipidemia, unspecified: Secondary | ICD-10-CM | POA: Diagnosis not present

## 2015-10-26 DIAGNOSIS — G4733 Obstructive sleep apnea (adult) (pediatric): Secondary | ICD-10-CM

## 2015-10-26 DIAGNOSIS — Z720 Tobacco use: Secondary | ICD-10-CM

## 2015-10-26 DIAGNOSIS — E119 Type 2 diabetes mellitus without complications: Secondary | ICD-10-CM

## 2015-10-26 MED ORDER — INSULIN ASPART 100 UNIT/ML ~~LOC~~ SOLN: 0.0000 [IU] | Freq: Three times a day (TID) | SUBCUTANEOUS | Status: AC

## 2015-10-26 MED ORDER — NICOTINE 21 MG/24HR TD PT24: 21.0000 mg | MEDICATED_PATCH | Freq: Every day | TRANSDERMAL | Status: DC

## 2015-10-26 NOTE — Progress Notes (Signed)
Patient ID: Cynthia Fuller, female   DOB: [birth year], 70 y.o.   MRN: [id number]   Location:  [facility] Room Number: 101-A Place of Service:  SNF (31)   CODE STATUS: Full Code  Allergies  Allergen Reactions  . Neomycin Hives    Chief Complaint  Patient presents with  . Hospitalization Follow-up    Hospital Follow up    HPI:  She has been hospitalized for lower extremity weakness. There was no clear etiology found for her weakness.  During the coarse of her hospitalization her strength improved. According to the hospital discharge there are stressors at home; which could be contributing to her medical issues. She is here for short term rehab. She is very motivated to go home.  She is requesting a nicotine patch for her smoking.    Past Medical History  Diagnosis Date  . Diabetes mellitus without complication (HCC)   . Hypertension   . Sleep apnea   . Hypercholesteremia   . Rheumatoid arthritis [facility])     Past Surgical History  Procedure Laterality Date  . Ablation      Social History   Social History  . Marital Status: Divorced    Spouse Name: N/A  . Number of Children: N/A  . Years of Education: N/A   Occupational History  . Not on file.   Social History Main Topics  . Smoking status: Smoker, Current Status Unknown    Types: Cigarettes  . Smokeless tobacco: Former Neurosurgeon    Quit date: 10/20/2015  . Alcohol Use: Not on file  . Drug Use: Not on file  . Sexual Activity: Not on file   Other Topics Concern  . Not on file   Social History Narrative   Family History  Problem Relation Age of Onset  . Diabetes Brother   . Kidney disease Brother       VITAL SIGNS BP 110/68 mmHg  Pulse 78  Temp(Src) 98.6 F (37 C) (Oral)  Resp 20  Ht 5\' 2"  (1.575 m)  Wt 194 lb (87.998 kg)  BMI 35.47 kg/m2  SpO2 99%  Patient's Medications  New Prescriptions   No medications on file  Previous Medications   ALPRAZOLAM (XANAX) 0.5 MG  TABLET    Take 0.5 mg by mouth 3 (three) times daily as needed for anxiety.   ASPIRIN EC 81 MG TABLET    Take 81 mg by mouth daily.   ATORVASTATIN (LIPITOR) 40 MG TABLET    Take 40 mg by mouth daily.   BENAZEPRIL-HYDROCHLORTHIAZIDE (LOTENSIN HCT) 20-12.5 MG TABLET    Take 1 tablet by mouth daily.   FLUTICASONE (FLONASE) 50 MCG/ACT NASAL SPRAY    Place 1 spray into both nostrils daily.   IBUPROFEN (ADVIL,MOTRIN) 200 MG TABLET    Take 200 mg by mouth every 6 (six) hours as needed (pain).    INSULIN ASPART (NOVOLOG) 100 UNIT/ML INJECTION    Inject 0-9 Units into the skin 3 (three) times daily before meals. Sliding scale   INSULIN GLARGINE (LANTUS) 100 UNIT/ML INJECTION    Inject 35 Units into the skin at bedtime.   IPRATROPIUM-ALBUTEROL (COMBIVENT RESPIMAT) 20-100 MCG/ACT AERS RESPIMAT    Inhale 1 puff into the lungs every 6 (six) hours as needed for wheezing.   METFORMIN (GLUCOPHAGE-XR) 500 MG 24 HR TABLET    Take 500 mg by mouth daily with breakfast.   PREDNISONE (DELTASONE) 5 MG TABLET    Take 10 mg by mouth daily.  Modified Medications   No medications on file  Discontinued Medications   PHENOL (CHLORASEPTIC) 1.4 % LIQD    Use as directed 1 spray in the mouth or throat as needed for throat irritation / pain.     SIGNIFICANT DIAGNOSTIC EXAMS  10-21-15; acute abdomen with chest x-ray; Mild congestion.  No focal consolidation. Constipation.  No bowel obstruction.  10-21-15: ct of head: No acute intracranial pathology.  10-21-15: mri of lumbar spine: 1. No acute osseous abnormality in the lumbar spine. Transitional anatomy. 2. Occasional abnormal signal in the bilateral posterior paraspinal muscles. Legrand Rams this is posttraumatic muscle contusion or myositis given the scattered and patchy morphology. No associated fluid collection. 3. Mild degenerative spondylolisthesis at L3-L4 and L4-L5 with associated mild disc and moderate facet degeneration. No lumbar spinal or lateral recess stenosis. There  is mild L3 and L4 foraminal stenosis.  10-21-15: mri of brain: Limited single sequence MRI of the brain: No acute ischemia.  10-22-15: mra of brain: Negative intracranial MRA.  LABS REVIEWED:   10-21-15; wbc 9.8; hgb 12.8; hct 39.0; mcv 91.5; plt 315; glucose 261; bun 16; creat 0.82; k+ 3.7; na++143; liver normal albumin 4.0; vit D 38.3; CK 260; vit B 12: 588 10-22-15: hgb a1c 8.1 10-24-15: glucose 161; bun 11; creat 0.75; k+ 3.9; na++140     Review of Systems  Constitutional: Negative for malaise/fatigue.  Respiratory: Negative for cough and shortness of breath.   Cardiovascular: Negative for chest pain, palpitations and leg swelling.  Gastrointestinal: Negative for heartburn, abdominal pain and constipation.  Musculoskeletal: Negative for myalgias, back pain and joint pain.  Skin: Negative.   Neurological: Negative for dizziness.  Psychiatric/Behavioral: The patient is not nervous/anxious.     Physical Exam  Constitutional: No distress.  Eyes: Conjunctivae are normal.  Neck: Neck supple. No JVD present. No thyromegaly present.  Cardiovascular: Normal rate, regular rhythm and intact distal pulses.   Respiratory: Effort normal and breath sounds normal. No respiratory distress. She has no wheezes.  GI: Soft. Bowel sounds are normal. She exhibits no distension. There is no tenderness.  Musculoskeletal: She exhibits no edema.  Able to move all extremities  Has RA deformities in bilateral hands Has slight head shaking present   Lymphadenopathy:    She has no cervical adenopathy.  Neurological: She is alert.  Skin: Skin is warm and dry. She is not diaphoretic.  Psychiatric: She has a normal mood and affect.      ASSESSMENT/ PLAN:  1. RA: has bilateral hand deformities: will continue her prednisone 10 mg daily will monitor her status.   2. Diabetes: hgb a1c is 8.1; will continue lantus 35 units nightly; metformin xr 500 mg daily will change her novolog SSI to: 100-150 5 units;  151-200 8 units; >200 10 units with meals  3. Hypertension: will continue lisinopril-hct 20/12.5 mg daily  Asa 81 mg daily   4. Dyslipidemia: will continue lipitor 40 mg daily  5. OSA: is on cpap at nightly will continue combivent respimat 1 puff every 6 hours as needed  6. Allergic rhinitis: will continue flonase daily   7. Anxiety: will continue xanax 0.5 mg three times daily as needed  8. Smoker: will begin nicotine patch 21 mg daily   9. Lower extremity weakness: will continue therapy as directed to improve upon strength and independence with her adl's.    Time spent with patient  50   minutes >50% time spent counseling; reviewing medical record; tests; labs; and developing future plan of  care   Synthia Innocent NP [facility]  Contact [phone] Monday through Friday 8am- 5pm  After hours call [phone]

## 2015-10-28 ENCOUNTER — Emergency Department (HOSPITAL_COMMUNITY): Payer: Medicare Other

## 2015-10-28 ENCOUNTER — Encounter (HOSPITAL_COMMUNITY): Payer: Self-pay | Admitting: Oncology

## 2015-10-28 ENCOUNTER — Emergency Department (HOSPITAL_COMMUNITY)
Admission: EM | Admit: 2015-10-28 | Discharge: 2015-10-28 | Disposition: A | Discharge status: Home / Self Care | Payer: Medicare Other | Attending: Emergency Medicine | Admitting: Emergency Medicine

## 2015-10-28 DIAGNOSIS — I10 Essential (primary) hypertension: Secondary | ICD-10-CM | POA: Diagnosis not present

## 2015-10-28 DIAGNOSIS — M5440 Lumbago with sciatica, unspecified side: Secondary | ICD-10-CM | POA: Diagnosis not present

## 2015-10-28 DIAGNOSIS — Z7982 Long term (current) use of aspirin: Secondary | ICD-10-CM | POA: Diagnosis not present

## 2015-10-28 DIAGNOSIS — Z794 Long term (current) use of insulin: Secondary | ICD-10-CM | POA: Diagnosis not present

## 2015-10-28 DIAGNOSIS — M545 Low back pain: Secondary | ICD-10-CM | POA: Diagnosis present

## 2015-10-28 DIAGNOSIS — F17210 Nicotine dependence, cigarettes, uncomplicated: Secondary | ICD-10-CM | POA: Diagnosis not present

## 2015-10-28 DIAGNOSIS — E119 Type 2 diabetes mellitus without complications: Secondary | ICD-10-CM | POA: Insufficient documentation

## 2015-10-28 DIAGNOSIS — M5441 Lumbago with sciatica, right side: Secondary | ICD-10-CM

## 2015-10-28 DIAGNOSIS — M5442 Lumbago with sciatica, left side: Secondary | ICD-10-CM

## 2015-10-28 MED ORDER — OXYCODONE HCL 5 MG PO TABS
10.0000 mg | ORAL_TABLET | Freq: Once | ORAL | Status: AC
  Administered 2015-10-28: 10 mg via ORAL
  Filled 2015-10-28: qty 2

## 2015-10-28 MED ORDER — HYDROMORPHONE HCL 2 MG/ML IJ SOLN
2.0000 mg | Freq: Once | INTRAMUSCULAR | Status: AC
  Administered 2015-10-28: 2 mg via INTRAMUSCULAR
  Filled 2015-10-28: qty 1

## 2015-10-28 MED ORDER — KETOROLAC TROMETHAMINE 60 MG/2ML IM SOLN
60.0000 mg | Freq: Once | INTRAMUSCULAR | Status: AC
  Administered 2015-10-28: 60 mg via INTRAMUSCULAR
  Filled 2015-10-28: qty 2

## 2015-10-28 MED ORDER — LIDOCAINE 5 % EX PTCH
1.0000 | MEDICATED_PATCH | CUTANEOUS | Status: DC
  Administered 2015-10-28: 1 via TRANSDERMAL
  Filled 2015-10-28 (×2): qty 1

## 2015-10-28 NOTE — ED Provider Notes (Signed)
CSN: [id number]     Arrival date & time 10/28/15  0865 History  By signing my name below, I, Cynthia Fuller, attest that this documentation has been prepared under the direction and in the presence of Cynthia Crumble, MD . Electronically Signed: Freida Fuller, Scribe. 10/28/2015. 4:00 AM.     Chief Complaint  Patient presents with  . Back Pain   The history is provided by the patient. No language interpreter was used.     HPI Comments:  Cynthia Fuller is a 70 y.o. female who presents to the Emergency Department complaining of moderate lower back pain with associated bilateral thigh pain x 1 day. She denies radiation of pain down to her feet and recent fall/injury. Her pain is exacerbated when seated for long periods of time. She has been taking tylenol without relief. She also reports constipation x 2 days. She also denies urinary symptoms, bowel/bladder incontinence, h/o CA and IVDA.[name] Pt ambulates with a cane.    Past Medical History  Diagnosis Date  . Diabetes mellitus without complication (HCC)   . Hypertension   . Sleep apnea   . Hypercholesteremia   . Rheumatoid arthritis [facility])    Past Surgical History  Procedure Laterality Date  . Ablation     Family History  Problem Relation Age of Onset  . Diabetes Brother   . Kidney disease Brother    Social History  Substance Use Topics  . Smoking status: Smoker, Current Status Unknown    Types: Cigarettes  . Smokeless tobacco: Former Neurosurgeon    Quit date: 10/20/2015  . Alcohol Use: None   OB History    No data available     Review of Systems 10 systems reviewed and all are negative for acute change except as noted in the HPI.   Allergies  Neomycin  Home Medications   Prior to Admission medications   Medication Sig Start Date End Date Taking? Authorizing Provider  ALPRAZolam Prudy Feeler) 0.5 MG tablet Take 0.5 mg by mouth 3 (three) times daily as needed for anxiety.   Yes Historical Provider, MD  aspirin EC 81 MG tablet Take 81 mg  by mouth daily.   Yes Historical Provider, MD  atorvastatin (LIPITOR) 40 MG tablet Take 40 mg by mouth daily.   Yes Historical Provider, MD  benazepril-hydrochlorthiazide (LOTENSIN HCT) 20-12.5 MG tablet Take 1 tablet by mouth daily.   Yes Historical Provider, MD  Fish Oil-Cholecalciferol (FISH OIL + D3 PO) Take 1 capsule by mouth daily.   Yes Historical Provider, MD  ibuprofen (ADVIL,MOTRIN) 200 MG tablet Take 200 mg by mouth every 6 (six) hours as needed (pain).    Yes Historical Provider, MD  insulin aspart (NOVOLOG) 100 UNIT/ML injection Inject 0-10 Units into the skin 3 (three) times daily before meals. 100-150=5 units 151-200=8 units >=201 = 10 units 10/26/15  Yes Cynthia Holster, NP  insulin glargine (LANTUS) 100 UNIT/ML injection Inject 35 Units into the skin at bedtime.   Yes Historical Provider, MD  Ipratropium-Albuterol (COMBIVENT RESPIMAT) 20-100 MCG/ACT AERS respimat Inhale 1 puff into the lungs every 6 (six) hours as needed for wheezing.   Yes Historical Provider, MD  Multiple Vitamins-Minerals (MULTIVITAMIN WITH MINERALS) tablet Take 1 tablet by mouth daily.   Yes Historical Provider, MD  predniSONE (DELTASONE) 5 MG tablet Take 10 mg by mouth daily.   Yes Historical Provider, MD  nicotine (NICODERM CQ - DOSED IN MG/24 HOURS) 21 mg/24hr patch Place 1 patch (21 mg total) onto the skin  daily. 10/26/15   Cynthia Holster, NP   BP 130/73 mmHg  Pulse 98  Temp(Src) 98.1 F (36.7 C) (Oral)  Resp 18  Ht 5\' 2"  (1.575 m)  Wt 193 lb (87.544 kg)  BMI 35.29 kg/m2  SpO2 100% Physical Exam  Constitutional: She is oriented to person, place, and time. She appears well-developed and well-nourished. No distress.  HENT:  Head: Normocephalic and atraumatic.  Nose: Nose normal.  Mouth/Throat: Oropharynx is clear and moist. No oropharyngeal exudate.  Eyes: Conjunctivae and EOM are normal. Pupils are equal, round, and reactive to light. No scleral icterus.  Neck: Normal range of motion. Neck supple.  No JVD present. No tracheal deviation present. No thyromegaly present.  Cardiovascular: Normal rate, regular rhythm and normal heart sounds.  Exam reveals no gallop and no friction rub.   No murmur heard. Pulmonary/Chest: Effort normal and breath sounds normal. No respiratory distress. She has no wheezes. She exhibits no tenderness.  Abdominal: Soft. Bowel sounds are normal. She exhibits no distension and no mass. There is no tenderness. There is no rebound and no guarding.  Musculoskeletal: Normal range of motion. She exhibits no edema or tenderness.  Lymphadenopathy:    She has no cervical adenopathy.  Neurological: She is alert and oriented to person, place, and time. No cranial nerve deficit. She exhibits normal muscle tone.  Normal strength and sensation to all extremities Normal cerebellar testing Nml gait   Skin: Skin is warm and dry. No rash noted. No erythema. No pallor.  Nursing note and vitals reviewed.   ED Course  Procedures   DIAGNOSTIC STUDIES:  Oxygen Saturation is 100% on RA, normal by my interpretation.    COORDINATION OF CARE:  3:48 AM Discussed treatment plan with pt at bedside and pt agreed to plan.  Labs Review Labs Reviewed - No data to display  Imaging Review Dg Lumbar Spine Complete  10/28/2015  CLINICAL DATA:  Back pain for 3 days. No injury. Pain radiates to both hips and legs. Decreased use of legs. EXAM: LUMBAR SPINE - COMPLETE 4+ VIEW COMPARISON:  MRI spine 10/21/2015.  Lumbar spine 10/21/2015. FINDINGS: Normal alignment of the lumbar spine. No vertebral compression deformities. No focal bone lesion or bone destruction. Degenerative changes in the lower lumbar facet joints. Transitional lumbosacral segment is numbered as L5 for the purposes of this study, using the same convention as with the prior MRI. Prominent vascular calcification in the aorta and iliac arteries. IMPRESSION: No acute bony abnormalities. Degenerative changes in the lower lumbar spine.  Electronically Signed   By: 12/21/2015 M.D.   On: 10/28/2015 05:49   I have personally reviewed and evaluated these images and lab results as part of my medical decision-making.   EKG Interpretation None      MDM   Final diagnoses:  None     Patient presents to the ED for back pain.  She has numbness in the legs as well, possible radiculopathy.  She has no red flag symptoms for back pain like IVDA, cancer, weight loss, night sweats.  Her neuro exam is normal as well.  She was given toradol, oxycodone and a lidocaine patch for treatment.    She was admitted to hospital for this last week and had an MRI done with full neuro evaluation.  This is not likely a new acute emergent event.  She was given dilaudid for continued pain. Will DC with instructions to see PCP within 3 days.  She appears well and in  NAD. VS remain within her normal limits and she is safe for DC.  I personally performed the services described in this documentation, which was scribed in my presence. The recorded information has been reviewed and is accurate.     Cynthia Crumble, MD 10/28/15 [phone]

## 2015-10-28 NOTE — ED Notes (Signed)
Bed: WA16 Expected date:  Expected time:  Means of arrival:  Comments: EMS-back pain 

## 2015-10-28 NOTE — ED Notes (Signed)
Per PTAR pt is c/o b/l hip pain as well as back pain.  Pt was recently hospitalized for the same and d/c'd to [facility] for rehab.  Pt did not care for [facility] facility so she went home.  Per PTAR pt is c/o b/l hip and back pain since yesterday, unable to find a comfortable position or sleep d/t pain.  Pt takes Xanax, however will not take a whole tab to facilitate sleeping.

## 2015-10-28 NOTE — Discharge Instructions (Signed)
Back Exercises Cynthia Fuller continue your home pain medication and see your primary care doctor within 3 days for close follow up. If symptoms worsen, come back to the ED immediately. Thank you. If you have pain in your back, do these exercises 2-3 times each day or as told by your doctor. When the pain goes away, do the exercises once each day, but repeat the steps more times for each exercise (do more repetitions). If you do not have pain in your back, do these exercises once each day or as told by your doctor. EXERCISES Single Knee to Chest Do these steps 3-5 times in a row for each leg: 1. Lie on your back on a firm bed or the floor with your legs stretched out. 2. Bring one knee to your chest. 3. Hold your knee to your chest by grabbing your knee or thigh. 4. Pull on your knee until you feel a gentle stretch in your lower back. 5. Keep doing the stretch for 10-30 seconds. 6. Slowly let go of your leg and straighten it. Pelvic Tilt Do these steps 5-10 times in a row: 1. Lie on your back on a firm bed or the floor with your legs stretched out. 2. Bend your knees so they point up to the ceiling. Your feet should be flat on the floor. 3. Tighten your lower belly (abdomen) muscles to press your lower back against the floor. This will make your tailbone point up to the ceiling instead of pointing down to your feet or the floor. 4. Stay in this position for 5-10 seconds while you gently tighten your muscles and breathe evenly. Cat-Cow Do these steps until your lower back bends more easily: 1. Get on your hands and knees on a firm surface. Keep your hands under your shoulders, and keep your knees under your hips. You may put padding under your knees. 2. Let your head hang down, and make your tailbone point down to the floor so your lower back is round like the back of a cat. 3. Stay in this position for 5 seconds. 4. Slowly lift your head and make your tailbone point up to the ceiling so your back  hangs low (sags) like the back of a cow. 5. Stay in this position for 5 seconds. Press-Ups Do these steps 5-10 times in a row: 1. Lie on your belly (face-down) on the floor. 2. Place your hands near your head, about shoulder-width apart. 3. While you keep your back relaxed and keep your hips on the floor, slowly straighten your arms to raise the top half of your body and lift your shoulders. Do not use your back muscles. To make yourself more comfortable, you may change where you place your hands. 4. Stay in this position for 5 seconds. 5. Slowly return to lying flat on the floor. Bridges Do these steps 10 times in a row: 1. Lie on your back on a firm surface. 2. Bend your knees so they point up to the ceiling. Your feet should be flat on the floor. 3. Tighten your butt muscles and lift your butt off of the floor until your waist is almost as high as your knees. If you do not feel the muscles working in your butt and the back of your thighs, slide your feet 1-2 inches farther away from your butt. 4. Stay in this position for 3-5 seconds. 5. Slowly lower your butt to the floor, and let your butt muscles relax. If this exercise is  too easy, try doing it with your arms crossed over your chest. Belly Crunches Do these steps 5-10 times in a row: 1. Lie on your back on a firm bed or the floor with your legs stretched out. 2. Bend your knees so they point up to the ceiling. Your feet should be flat on the floor. 3. Cross your arms over your chest. 4. Tip your chin a little bit toward your chest but do not bend your neck. 5. Tighten your belly muscles and slowly raise your chest just enough to lift your shoulder blades a tiny bit off of the floor. 6. Slowly lower your chest and your head to the floor. Back Lifts Do these steps 5-10 times in a row: 1. Lie on your belly (face-down) with your arms at your sides, and rest your forehead on the floor. 2. Tighten the muscles in your legs and your  butt. 3. Slowly lift your chest off of the floor while you keep your hips on the floor. Keep the back of your head in line with the curve in your back. Look at the floor while you do this. 4. Stay in this position for 3-5 seconds. 5. Slowly lower your chest and your face to the floor. GET HELP IF:  Your back pain gets a lot worse when you do an exercise.  Your back pain does not lessen 2 hours after you exercise. If you have any of these problems, stop doing the exercises. Do not do them again unless your doctor says it is okay. GET HELP RIGHT AWAY IF:  You have sudden, very bad back pain. If this happens, stop doing the exercises. Do not do them again unless your doctor says it is okay.   This information is not intended to replace advice given to you by your health care provider. Make sure you discuss any questions you have with your health care provider.   Document Released: 07/01/2010 Document Revised: 02/17/2015 Document Reviewed: 07/23/2014 Elsevier Interactive Patient Education [employer]

## 2015-10-28 NOTE — ED Notes (Signed)
Patient transported to X-ray 

## 2015-10-29 ENCOUNTER — Emergency Department (HOSPITAL_COMMUNITY)
Admission: EM | Admit: 2015-10-29 | Discharge: 2015-10-29 | Discharge status: Against Medical Advice | Payer: Medicare Other

## 2015-10-29 ENCOUNTER — Emergency Department (HOSPITAL_COMMUNITY): Payer: Medicare Other

## 2015-10-29 ENCOUNTER — Encounter (HOSPITAL_COMMUNITY): Payer: Self-pay | Admitting: Emergency Medicine

## 2015-10-29 ENCOUNTER — Emergency Department (HOSPITAL_COMMUNITY)
Admission: EM | Admit: 2015-10-29 | Discharge: 2015-10-29 | Disposition: A | Discharge status: Other Acute Inpatient Hospital | Payer: Medicare Other | Attending: Emergency Medicine | Admitting: Emergency Medicine

## 2015-10-29 DIAGNOSIS — R531 Weakness: Secondary | ICD-10-CM | POA: Diagnosis not present

## 2015-10-29 DIAGNOSIS — Z7982 Long term (current) use of aspirin: Secondary | ICD-10-CM | POA: Diagnosis not present

## 2015-10-29 DIAGNOSIS — M545 Low back pain, unspecified: Secondary | ICD-10-CM

## 2015-10-29 DIAGNOSIS — I10 Essential (primary) hypertension: Secondary | ICD-10-CM | POA: Insufficient documentation

## 2015-10-29 DIAGNOSIS — E119 Type 2 diabetes mellitus without complications: Secondary | ICD-10-CM | POA: Diagnosis not present

## 2015-10-29 DIAGNOSIS — F17210 Nicotine dependence, cigarettes, uncomplicated: Secondary | ICD-10-CM | POA: Diagnosis not present

## 2015-10-29 DIAGNOSIS — M069 Rheumatoid arthritis, unspecified: Secondary | ICD-10-CM | POA: Diagnosis not present

## 2015-10-29 DIAGNOSIS — R29898 Other symptoms and signs involving the musculoskeletal system: Secondary | ICD-10-CM

## 2015-10-29 DIAGNOSIS — Z79899 Other long term (current) drug therapy: Secondary | ICD-10-CM | POA: Diagnosis not present

## 2015-10-29 DIAGNOSIS — Z794 Long term (current) use of insulin: Secondary | ICD-10-CM | POA: Insufficient documentation

## 2015-10-29 DIAGNOSIS — R110 Nausea: Secondary | ICD-10-CM | POA: Diagnosis not present

## 2015-10-29 DIAGNOSIS — K5900 Constipation, unspecified: Secondary | ICD-10-CM

## 2015-10-29 LAB — CBC WITH DIFFERENTIAL/PLATELET
BASOS ABS: 0 10*3/uL (ref 0.0–0.1)
BASOS PCT: 0 %
EOS PCT: 2 %
Eosinophils Absolute: 0.1 10*3/uL (ref 0.0–0.7)
HCT: 38.8 % (ref 36.0–46.0)
Hemoglobin: 12.8 g/dL (ref 12.0–15.0)
Lymphocytes Relative: 44 %
Lymphs Abs: 3.1 10*3/uL (ref 0.7–4.0)
MCH: 30.4 pg (ref 26.0–34.0)
MCHC: 33 g/dL (ref 30.0–36.0)
MCV: 92.2 fL (ref 78.0–100.0)
MONO ABS: 0.7 10*3/uL (ref 0.1–1.0)
Monocytes Relative: 10 %
Neutro Abs: 3.1 10*3/uL (ref 1.7–7.7)
Neutrophils Relative %: 44 %
PLATELETS: 345 10*3/uL (ref 150–400)
RBC: 4.21 MIL/uL (ref 3.87–5.11)
RDW: 14.9 % (ref 11.5–15.5)
WBC: 7 10*3/uL (ref 4.0–10.5)

## 2015-10-29 LAB — LIPASE, BLOOD: Lipase: 23 U/L (ref 11–51)

## 2015-10-29 LAB — URINALYSIS, ROUTINE W REFLEX MICROSCOPIC
Bilirubin Urine: NEGATIVE
Glucose, UA: NEGATIVE mg/dL
Hgb urine dipstick: NEGATIVE
Ketones, ur: NEGATIVE mg/dL
Leukocytes, UA: NEGATIVE
Nitrite: NEGATIVE
Protein, ur: NEGATIVE mg/dL
Specific Gravity, Urine: 1.006 (ref 1.005–1.030)
pH: 7.5 (ref 5.0–8.0)

## 2015-10-29 LAB — COMPREHENSIVE METABOLIC PANEL
ALT: 34 U/L (ref 14–54)
AST: 28 U/L (ref 15–41)
Albumin: 4 g/dL (ref 3.5–5.0)
Alkaline Phosphatase: 47 U/L (ref 38–126)
Anion gap: 9 (ref 5–15)
BUN: 16 mg/dL (ref 6–20)
CO2: 29 mmol/L (ref 22–32)
Calcium: 9.6 mg/dL (ref 8.9–10.3)
Chloride: 105 mmol/L (ref 101–111)
Creatinine, Ser: 0.66 mg/dL (ref 0.44–1.00)
GFR calc Af Amer: >60 mL/min (ref >60)
GFR calc non Af Amer: >60 mL/min (ref >60)
Glucose, Bld: 103 mg/dL — ABNORMAL HIGH (ref 65–99)
Potassium: 4 mmol/L (ref 3.5–5.1)
Sodium: 143 mmol/L (ref 135–145)
Total Bilirubin: 0.5 mg/dL (ref 0.3–1.2)
Total Protein: 7.8 g/dL (ref 6.5–8.1)

## 2015-10-29 LAB — CBG MONITORING, ED: Glucose-Capillary: 99 mg/dL (ref 65–99)

## 2015-10-29 MED ORDER — ALPRAZOLAM 0.5 MG PO TABS: 0.5000 mg | ORAL_TABLET | Freq: Every evening | ORAL | Status: DC | PRN

## 2015-10-29 MED ORDER — POLYETHYLENE GLYCOL 3350 17 G PO PACK: 17.0000 g | PACK | Freq: Every day | ORAL | Status: AC

## 2015-10-29 MED ORDER — ALPRAZOLAM 0.5 MG PO TABS: 0.5000 mg | ORAL_TABLET | Freq: Three times a day (TID) | ORAL | Status: AC | PRN

## 2015-10-29 NOTE — ED Notes (Signed)
PA at bedside.  Patient's grandson is at bedside as well.

## 2015-10-29 NOTE — Progress Notes (Addendum)
ED CM visited pt who confirms with Cm she is coming to [facility] ED today from home Pt states she left [facility] on "tuesday on my own accord" or AMA - Cm discussed with pt and grandson at bedside that leaving AMA does not require facilities to provide follow up care This is why pt did not have home health services set up from [facility].  Pt voiced understanding Discussed with her that her pcp could assist with home health but pt would need to be able to meet the criteria for home health (homebound, primary teachable care giver, coverage and a safe environment for home health staff to make visits) Pt states she lives alone without anyone who does not work to come stay with her Pt recalls having an aide "[place] I [place] in another county" and can not recall the name of the home health agency Cm discussed PDN (private duty services) but pt states I can not afford to pay and do not believe I have a long term care policy  Pt confirmed being seen by ED SW and pending interventions Pt voiced preference to d/c to a facility to receive care  As Cm was attempting to leave pt room she request assist to get to bedside commode and stated she would need more than 2 people because she had already tried and went to the fall with 2 ED staff members. Cm went to ask ED NT to assist but was informed it would be unsafe to attempt to get pt to beside commode CM discussed this with pt who agreed to allow Cm to get her on the bedpan (NT & pt had confirmed pt refused the bedpan earlier)  Placed pt call bell next to her Lucila Maine present in room speaking with a female family member who called on phone in ED rm #[id number] as Cm was assisting pt to bedpan.  Grandson overheard sharing with female what Cm discussed with him & pt about not receiving services after AMA.  Pt only has a RW at home per pt  AS CM was leaving ED PA/NP came into pt room Cm informed him of pt needing to void but unable to get up to bedside commode and is on bedpan-  NP/PA inquired about Cm thoughts on pt d/c plan and mention he does not feel pt is a home health candidate CM discussed with him that pt with d/c within 30 days, pending bed search availability Referred him to ED SW Informed him pt voiced preference to d/c to a facility to receive care  Pt given copies pf home health agencies and PDN list as discussed with her

## 2015-10-29 NOTE — Discharge Instructions (Signed)
CLARIFICATION:  Xanax is to be 0.5mg  3 times per day as needed. Please see new RX.

## 2015-10-29 NOTE — Progress Notes (Addendum)
CSW spoke with patient's grandson, Yehuda Savannah, as patient was being taken out of the room for testing. Patient's grandson reports he lives in [place]. He reports patient lives at home alone, however, family visits regularly. He reports patient presents to [facility] due to back and thigh pain. He reports patient cannot sit down for a period of time and this has been going on for the past five days. Patient reports she was discharged from [facility] yesterday and she came back around 1:00am because of severe pain.    Patient's grandson provided contact numbers for two of patient's daughters: Lottie Dawson [phone] and Grafton Folk [phone]. Patient's grandson called the daughter, Sabino Niemann for CSW to obtain information. Patient's daughter stated when her mother was given Metformin as a medication to take, patient began complaining the whole week. Daughter reports patient has diabetes, rheumatoid arthritis, blood pressure problems, and sinus allergies.   Patient returned to the room and CSW obtained information from patient. Patient's reports she began to feel nausea with dizziness, she checked her blood glucose and it was in the 190's. Patient reports last Wednesday, she called 911. Patient reports she now has paralysis in her legs and no one has given her a diagnosis.   Patient reports she was sent to [facility] for rehab and she does not want to return to there facility. Patient reports the wheelchair was nasty and she killed a roach on the floor. Patient reports she spoke with a lady about the "filth on the wheelchair". Patient reports she is a smoker and she asked the Nurse at [facility] to take her out for a smoke. Patient reports she saw the sign on the front of the building for no smoking, therefore she had the Nurse take her to the back of the building to smoke. Patient reports a gentleman informed her if she did not put out her cigarette, he would give her a thirty day notice. Patient stated she  did not like the manner in which the man spoke to her outside. Patient reports the Admissions Director asked her not to leave because she needed to sign some paperwork. Patient reports her daughter came to pick her up and she left the facility.  CSW staffed with PA-C and Asst.Social Work Interior and spatial designer. PA-C stated patient is not medically cleared at this time. He stated patient is unable to ambulate even with a walker and she continues to refuse to go to [facility] [employer]. PA-C stated he would speak with the Attending Physician.    CSW left messages for 1st Shift ED CM and 2nd Shift ED CM to inform them of the recent update from the PA-C regarding patient being unable to ambulate.   CSW spoke with Asst. Social Work Interior and spatial designer and he stated to call [employer] with [employer]. CSW spoke with Tammy and she stated as long as patient knows she cannot smoke at the facility, she would consider. CSW spoke with patient to inform her per Tammy that all facilities in [facility] are smoke free. CSW informed her with her daughter and grandson both present that she would not be able to smoke at the facility. Patient stated she did not have a problem with the smoking only with the man that spoke to her as she stated in a disrespectful manner.    CSW spoke with Tammy and she stated she would need the FL2, AVS and current med list. PA-C signed FL2 and provided AVS. Tammy also stated patient would need a PT evaluation completed. PA-C  put in an order for a PT evaluation. PA-C stated if patient could not go today to the facility, she would be sent to Internal Medicine at [facility]. CSW informed Tammy of this information. CSW faxed information.   2nd Shift ED CM stated she would call Tammy to inform her that patient had a PT evaluation completed on 10-25-15.   Elenore Paddy [phone] ED CSW 4:52 PM   10/29/2015

## 2015-10-29 NOTE — ED Notes (Signed)
Per CSW, Pt is still refusing to go back to [facility], but has agreed to go to a different facility.  CSW calling "Tammy" regarding other facility.

## 2015-10-29 NOTE — Progress Notes (Signed)
With review of EPIC last admission notes -SW notes on 10/25/15 "CSW spoke with pt and pt dtr at bedside concerning CIR vs SNF- pt would like [facility] and can't be admitted to CIR. CSW spoke with CIR coordinator who confirmed pt not able to go to CIR."

## 2015-10-29 NOTE — Progress Notes (Signed)
Pt provided with a pillow and assisted with turning to left side Pt continues to c/o cramp in left abdominal and wanting CBG checked Cm informed ED charge RN  Pt with a female visitor in her room now who informed CM that SW returned to see her and is looking at a facility to send her to Pt is agreeing to going to a facility  ED PM CM informed of what pt/family informed Cm

## 2015-10-29 NOTE — ED Notes (Signed)
Patient states that she was so tired this morning because she couldn't sleep due to pain that she fell asleep standing up with her walker that she fell in the floor.  Patient denies hitting her head.

## 2015-10-29 NOTE — ED Notes (Signed)
PTAR made aware of transport needed to [facility].

## 2015-10-29 NOTE — ED Provider Notes (Signed)
CSN: [id number]     Arrival date & time 10/29/15  1107 History   First MD Initiated Contact with Patient 10/29/15 1115     Chief Complaint  Patient presents with  . Back Pain  . Abdominal Pain  . Nausea    HPI    70 year old female presents today with numerous complaints.  Patient was most recently discharged from the hospital on 10/25/2015. Patient was admitted due to lower extremity weakness, this was acute in onset. Patient received DG acute abnormal chest, CT head, DG lumbar spine, MR lumbar spine with and without contrast, MRI brain, MRA head without; she is found to have no acute findings. She reports strength return while in hospital, was discharged to [facility]. She reports that over the night she saw numerous bugs, stating she eats, and the following morning left the facility against their advice. Patient notes that she's been at home since then, ambulating home with use of a walker. Patient reports that yesterday she started developing low back pain and bilateral thigh pain. She reports symptoms are exacerbated by prolonged periods of sitting, standing. She was seen in the emergency room receiving DG lumbar spine which showed no significant findings. Patient reports she was discharged home, but continued to have discomfort after pain medication wore off from her hospital visit. She notes very little sleep because every position is uncomfortable, reports that she fell asleep while standing in her walker. She denies any trauma from her fall, at the time of evaluation reports that the hip pain has dissipatedWith the use of lidocaine patch. Patient reports that she's having minor left lower quadrant pain. Patient reports that she has not had a normal bowel movement in 4 days, denies any nausea, vomiting, fever, chills, upper abdominal pain, changes in color clarity or characteristics of her urine. Patient reports she was eating and drinking yesterday without  difficulty, no by mouth prior to ED evaluation. Patient denies any abdominal surgeries. Patient reports that she will not go back to[facility] and would like in-home care services.   Past Medical History  Diagnosis Date  . Diabetes mellitus without complication (HCC)   . Hypertension   . Sleep apnea   . Hypercholesteremia   . Rheumatoid arthritis [facility])    Past Surgical History  Procedure Laterality Date  . Ablation     Family History  Problem Relation Age of Onset  . Diabetes Brother   . Kidney disease Brother    Social History  Substance Use Topics  . Smoking status: Smoker, Current Status Unknown    Types: Cigarettes  . Smokeless tobacco: Former Neurosurgeon    Quit date: 10/20/2015  . Alcohol Use: None   OB History    No data available     Review of Systems  All other systems reviewed and are negative.   Allergies  Neomycin  Home Medications   Prior to Admission medications   Medication Sig Start Date End Date Taking? Authorizing Provider  aspirin EC 81 MG tablet Take 81 mg by mouth daily.   Yes Historical Provider, MD  atorvastatin (LIPITOR) 40 MG tablet Take 40 mg by mouth daily.   Yes Historical Provider, MD  benazepril-hydrochlorthiazide (LOTENSIN HCT) 20-12.5 MG tablet Take 1 tablet by mouth daily.   Yes Historical Provider, MD  Fish Oil-Cholecalciferol (FISH OIL + D3 PO) Take 1 capsule by mouth daily.   Yes Historical Provider, MD  ibuprofen (ADVIL,MOTRIN) 200 MG tablet Take 200 mg by mouth every 6 (six)  hours as needed (pain).    Yes Historical Provider, MD  insulin aspart (NOVOLOG) 100 UNIT/ML injection Inject 0-10 Units into the skin 3 (three) times daily before meals. 100-150=5 units 151-200=8 units >=201 = 10 units 10/26/15  Yes [name], NP  insulin glargine (LANTUS) 100 UNIT/ML injection Inject 35 Units into the skin at bedtime.   Yes Historical Provider, MD  Ipratropium-Albuterol (COMBIVENT RESPIMAT) 20-100 MCG/ACT AERS respimat Inhale 1 puff into the  lungs every 6 (six) hours as needed for wheezing.   Yes Historical Provider, MD  Multiple Vitamins-Minerals (MULTIVITAMIN WITH MINERALS) tablet Take 1 tablet by mouth daily.   Yes Historical Provider, MD  predniSONE (DELTASONE) 5 MG tablet Take 10 mg by mouth daily.   Yes Historical Provider, MD  ALPRAZolam Prudy Feeler) 0.5 MG tablet Take 1 tablet (0.5 mg total) by mouth 3 (three) times daily as needed for anxiety. 10/29/15   Rolland Porter, MD  nicotine (NICODERM CQ - DOSED IN MG/24 HOURS) 21 mg/24hr patch Place 1 patch (21 mg total) onto the skin daily. Patient not taking: Reported on 10/29/2015 10/26/15   [name], NP  polyethylene glycol [facility] packet Take 17 g by mouth daily. 10/29/15   Kimbrely Buckel, PA-C   BP 188/82 mmHg  Pulse 97  Temp(Src) 98.6 F (37 C) (Oral)  Resp 19  SpO2 95%   Physical Exam  Constitutional: She is oriented to person, place, and time. She appears well-developed and well-nourished.  HENT:  Head: Normocephalic and atraumatic.  Eyes: Conjunctivae are normal. Pupils are equal, round, and reactive to light. Right eye exhibits no discharge. Left eye exhibits no discharge. No scleral icterus.  Neck: Normal range of motion. No JVD present. No tracheal deviation present.  Pulmonary/Chest: Effort normal. No stridor.  Abdominal: Soft. She exhibits no distension and no mass. There is no rebound and no guarding.  Musculoskeletal:  No C or T-spine tenderness. Patient has tenderness to palpation of the lumbar spine.   Neurological: She is alert and oriented to person, place, and time. Coordination normal.  Bilateral lower extremity sensation intact, patient has good quadricep contraction with straight leg, but is unable to perform straight leg raise. Patient is able to have some hip flexion with different movements, none on command.  Upper extremity strength 5 out of 5, sensation intact.  Patient is able to stand with the assistance of a walker, unable to ambulate with  walker  No saddle anesthesia  Skin: Skin is warm and dry. No rash noted. No erythema. No pallor.  Psychiatric: She has a normal mood and affect. Her behavior is normal. Judgment and thought content normal.  Nursing note and vitals reviewed.   ED Course  Procedures (including critical care time) Labs Review Labs Reviewed  COMPREHENSIVE METABOLIC PANEL - Abnormal; Notable for the following:    Glucose, Bld 103 (*)    All other components within normal limits  LIPASE, BLOOD  URINALYSIS, ROUTINE W REFLEX MICROSCOPIC (NOT AT [facility])  CBC WITH DIFFERENTIAL/PLATELET  CBC WITH DIFFERENTIAL/PLATELET  CBG MONITORING, ED    Imaging Review Dg Lumbar Spine Complete  10/28/2015  CLINICAL DATA:  Back pain for 3 days. No injury. Pain radiates to both hips and legs. Decreased use of legs. EXAM: LUMBAR SPINE - COMPLETE 4+ VIEW COMPARISON:  MRI spine 10/21/2015.  Lumbar spine 10/21/2015. FINDINGS: Normal alignment of the lumbar spine. No vertebral compression deformities. No focal bone lesion or bone destruction. Degenerative changes in the lower lumbar facet joints. Transitional lumbosacral  segment is numbered as L5 for the purposes of this study, using the same convention as with the prior MRI. Prominent vascular calcification in the aorta and iliac arteries. IMPRESSION: No acute bony abnormalities. Degenerative changes in the lower lumbar spine. Electronically Signed   By: Burman Nieves M.D.   On: 10/28/2015 05:49   Dg Abd Acute W/chest  10/29/2015  CLINICAL DATA:  Mid to lower abdominal pain and nausea for 5 days, history diabetes mellitus, hypertension EXAM: DG ABDOMEN ACUTE W/ 1V CHEST COMPARISON:  10/21/2015 FINDINGS: Upper normal heart size. Normal mediastinal contours and pulmonary vascularity for technique. Atherosclerotic calcification aorta. Lungs clear. No pleural effusion or pneumothorax. Nonobstructive bowel gas pattern. No bowel dilatation, bowel wall thickening, or free intraperitoneal air.  Diffuse osseous demineralization. No urinary tract calcification. IMPRESSION: No acute abnormalities. Electronically Signed   By: Ulyses Southward M.D.   On: 10/29/2015 12:33   I have personally reviewed and evaluated these images and lab results as part of my medical decision-making.   EKG Interpretation None      MDM   Final diagnoses:  Bilateral low back pain without sciatica  Weakness of both lower extremities  Constipation, unspecified constipation type    Labs:Urinalysis, CBC, CMP, lipase  Imaging: DG abdomen acute with chest- No acute abnormalities ( previous DG lumbar spine, MR MRA head, MR brain, MR lumbar spine, DG lumbar spine, CT head without)   Consults: Social work  Therapeutics:  Discharge Meds:   Assessment/Plan: 70 year old female presents today with back and abdominal pain. Patient has history of ongoing back pain with lower extremity complaints. She is recently had extensive MRI, CT, plain films with no significant findings. Patient has no sensory deficits, has been ambulating up until this morning, but currently cannot ambulate here in the ED. Patient has active strong contraction of the quadriceps, but is unable to lift her legs off of the bed. She is able to stand on her own 2 feet, but appears to give out. Nursing staff made me aware that they attempted the use the bedside commode and patient fell to the ground, no injuries, she was lowered slowly to the ground. Uncertain if this is due to patient effort or true neurological weakness. Patient is afebrile, with no signs of infectious etiology. Plain films of the abdomen show no bowel dilatation, wall thickening or and peritoneal air. Repeat abdominal exam shows very minimal discomfort. Patient likely has some mild constipation as she has not had a bowel movement in 5 days, this is likely due to decreased ambulation, and pain medication. Patient has minimal tenderness to the lumbar spine, she has no thoracic or cervical  spinal tenderness, no signs of infectious etiology. I consult at case management as patient was unwilling to go back to rehabilitation facility. Patient needs some formal rehabilitation as this is likely due to muscular deconditioning. We will able to arrange skilled nursing facility placement, patient and family agreed to the plan. I gave the patient strict return precautions, and encouraged her to follow up with her primary care provider in 3 days for reevaluation. Both the patient, and her family verbalized understanding and agreement today's plan and follow-up with primary care.        Eyvonne Mechanic, PA-C 10/29/15 2020  Rolland Porter, MD 11/04/15 [phone]

## 2015-10-29 NOTE — ED Notes (Signed)
Per EMS pt c/o lower left back pain same as yesterday. Patient having abd pain and nausea today, patient hasnt had BM since Tuesday.  Patient hasn't had BP meds today. CBG 139, BP 200/128, HR 95

## 2015-10-29 NOTE — NC FL2 (Signed)
MEDICAID FL2 LEVEL OF CARE SCREENING TOOL     IDENTIFICATION  Patient Name: Cynthia Fuller Birthdate: [birth year] Sex: female Admission Date (Current Location): 10/29/2015  [facility] and [place] Number:  Producer, television/film/video and Address:  [facility],  [address], [place] [address]      Provider Number: [id number]  Attending Physician Name and Address:  Rolland Porter, MD  Relative Name and Phone Number:       Current Level of Care: Hospital Recommended Level of Care: Skilled Nursing Facility Prior Approval Number:    Date Approved/Denied:   PASRR Number:    Discharge Plan: SNF    Current Diagnoses: Patient Active Problem List   Diagnosis Date Noted  . Dyslipidemia associated with type 2 diabetes mellitus (HCC) 10/26/2015  . Tobacco abuse   . Abnormality of gait   . Leg weakness   . Lower extremity weakness 10/21/2015  . 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 Hypertension 10/21/2015  . OSA (obstructive sleep apnea) 10/21/2015  . HLD (hyperlipidemia) 10/21/2015  . Rheumatoid arthritis (HCC) 10/21/2015    Orientation RESPIRATION BLADDER Height & Weight     Self, Time, Situation, Place  Normal Continent Weight:   Height:     BEHAVIORAL SYMPTOMS/MOOD NEUROLOGICAL BOWEL NUTRITION STATUS       (Per PA-C, patient having difficulty with constipation) Diet  AMBULATORY STATUS COMMUNICATION OF NEEDS Skin   Extensive Assist Verbally Normal                       Personal Care Assistance Level of Assistance  Total care Bathing Assistance: Limited assistance Feeding assistance: Independent Dressing Assistance: Limited assistance     Functional Limitations Info    Sight Info: Adequate        SPECIAL CARE FACTORS FREQUENCY  PT (By licensed PT) (Per PA-C, patient will need PT daily)                    Contractures      Additional Factors Info                  Current Medications (10/29/2015):   This is the current hospital active medication list No current facility-administered medications for this encounter.   Current Outpatient Prescriptions  Medication Sig Dispense Refill  . ALPRAZolam (XANAX) 0.5 MG tablet Take 0.5 mg by mouth 3 (three) times daily as needed for anxiety.    [name] aspirin EC 81 MG tablet Take 81 mg by mouth daily.    [name] atorvastatin (LIPITOR) 40 MG tablet Take 40 mg by mouth daily.    . benazepril-hydrochlorthiazide (LOTENSIN HCT) 20-12.5 MG tablet Take 1 tablet by mouth daily.    . Fish Oil-Cholecalciferol (FISH OIL + D3 PO) Take 1 capsule by mouth daily.    [name] ibuprofen (ADVIL,MOTRIN) 200 MG tablet Take 200 mg by mouth every 6 (six) hours as needed (pain).     . insulin aspart (NOVOLOG) 100 UNIT/ML injection Inject 0-10 Units into the skin 3 (three) times daily before meals. 100-150=5 units 151-200=8 units >=201 = 10 units 3 vial PRN  . insulin glargine (LANTUS) 100 UNIT/ML injection Inject 35 Units into the skin at bedtime.    . Ipratropium-Albuterol (COMBIVENT RESPIMAT) 20-100 MCG/ACT AERS respimat Inhale 1 puff into the lungs every 6 (six) hours as needed for wheezing.    . Multiple Vitamins-Minerals (MULTIVITAMIN WITH MINERALS) tablet Take 1 tablet by mouth daily.    [name]  predniSONE (DELTASONE) 5 MG tablet Take 10 mg by mouth daily.    . nicotine (NICODERM CQ - DOSED IN MG/24 HOURS) 21 mg/24hr patch Place 1 patch (21 mg total) onto the skin daily. (Patient not taking: Reported on 10/29/2015) 28 patch 0     Discharge Medications: Please see discharge summary for a list of discharge medications.  Relevant Imaging Results:  Relevant Lab Results:   Additional Information    Claudean Severance, LCSW

## 2015-10-29 NOTE — ED Notes (Signed)
Social worker at bedside.

## 2015-10-29 NOTE — Progress Notes (Signed)
Patient to be discharged to [facility] Per day shift EDSW.  However, EDSW reports patient is requiring PT evaluation prior to leaving.  Patient was discharged from hospital on 05/15.  EDCM called Tammy of [facility] and inquired if PT notes from hospital stay will be acceptable.  Per Tammy, patient needs a PT evaluation 24-48 hours prior to going to facility.  Tammy reports she will see if her physical therapist at the facility will see patient tomorrow which will be acceptable.  Tammy reports the physical therapist will be able to see patient tomorrow at facility which will be acceptable.  We may now call PTAR to have patient transferred.  Tammy also requesting prescription for Xanax and to make sure all medications on FL2 and AVS match.  Medications on AVS missing route and time to be given.  Tammy reports it is acceptable to write in missing medication information on AVS.  EDPA filled in missing medication information on AVS.  Prednisone has been discontinued. PA made a note that prednisone has been discontinued on AVS.  Tammy of [facility] made aware.  Xanax prescription printed and placed with patient's discharge instructions.  Corrected medication list sent to Tammy at fax number [phone] with confirmation of receipt.  [facility] informed by EDRN that patient's family requesting information on the facility.  EDCM is not knowledgeable on this facility and unable to print information from computer on facility.  EDCM called [facility] EDSW who recommended telling family to google facility on [email or web address]  EDCM provided patient's family with this information and also provided them with address and phone number to facility.  EDCM offered support to patient and family.  Encouraged patient and family to contact the social worker at the new facility if any concerns should arise.  Patient and patient's family thankful for services.  No further EDCm needs at this time.  Discussed with Asst director of SW.

## 2015-10-29 NOTE — Progress Notes (Signed)
ED PA/NP consulted CM for a SW concern -pt need rehab not liking facility she is at - Cm recommended SW consult to NP/PA & entered SW consult in [facility]

## 2015-10-29 NOTE — ED Notes (Signed)
Attempted to walk pt with PA. Unsuccessful. Pt the stated she needed to use restroom. Pt offered bedpan. Pt refused. Pt stated she wanted a wheelchair to go to restroom. I explained to Pt I didn't feel comfortable taked pt to restroom and again offered bedpan. Pt then wanted to use bedside commode. Myself along with Megan, EMT assisted pt to bedside commode with pt using walker. Pt adjusted herself several times on bedside commode before demanding to get back in bed. I along with Megan assisted pt standing. Pt then attempted to start lowering herself to floor. I placed my leg behind pt and assisted pt to the floor. Then Lyla Son, RN , Lequita Halt, RN and [place], RN picked pt up and placed her back into bed. PA jeff made aware of what happened.

## 2015-11-01 ENCOUNTER — Encounter: Payer: Self-pay | Admitting: Adult Health

## 2015-11-01 ENCOUNTER — Non-Acute Institutional Stay (SKILLED_NURSING_FACILITY): Payer: Medicare Other | Admitting: Adult Health

## 2015-11-01 ENCOUNTER — Observation Stay (HOSPITAL_COMMUNITY)
Admission: EM | Admit: 2015-11-01 | Discharge: 2015-11-03 | Disposition: A | Discharge status: Skilled Nursing Facility | Payer: Medicare Other | Attending: Internal Medicine | Admitting: Internal Medicine

## 2015-11-01 ENCOUNTER — Emergency Department (HOSPITAL_COMMUNITY): Payer: Medicare Other

## 2015-11-01 ENCOUNTER — Encounter (HOSPITAL_COMMUNITY): Payer: Self-pay | Admitting: Nurse Practitioner

## 2015-11-01 DIAGNOSIS — M4806 Spinal stenosis, lumbar region: Secondary | ICD-10-CM | POA: Insufficient documentation

## 2015-11-01 DIAGNOSIS — Z91048 Other nonmedicinal substance allergy status: Secondary | ICD-10-CM | POA: Insufficient documentation

## 2015-11-01 DIAGNOSIS — E1169 Type 2 diabetes mellitus with other specified complication: Secondary | ICD-10-CM

## 2015-11-01 DIAGNOSIS — F40240 Claustrophobia: Secondary | ICD-10-CM | POA: Insufficient documentation

## 2015-11-01 DIAGNOSIS — Z800 Family history of malignant neoplasm of digestive organs: Secondary | ICD-10-CM | POA: Insufficient documentation

## 2015-11-01 DIAGNOSIS — M069 Rheumatoid arthritis, unspecified: Secondary | ICD-10-CM | POA: Insufficient documentation

## 2015-11-01 DIAGNOSIS — F419 Anxiety disorder, unspecified: Secondary | ICD-10-CM | POA: Insufficient documentation

## 2015-11-01 DIAGNOSIS — R1013 Epigastric pain: Secondary | ICD-10-CM

## 2015-11-01 DIAGNOSIS — M5126 Other intervertebral disc displacement, lumbar region: Secondary | ICD-10-CM | POA: Diagnosis not present

## 2015-11-01 DIAGNOSIS — R29898 Other symptoms and signs involving the musculoskeletal system: Secondary | ICD-10-CM | POA: Diagnosis not present

## 2015-11-01 DIAGNOSIS — I10 Essential (primary) hypertension: Secondary | ICD-10-CM | POA: Diagnosis present

## 2015-11-01 DIAGNOSIS — R531 Weakness: Secondary | ICD-10-CM | POA: Diagnosis not present

## 2015-11-01 DIAGNOSIS — K5901 Slow transit constipation: Principal | ICD-10-CM | POA: Insufficient documentation

## 2015-11-01 DIAGNOSIS — I7419 Embolism and thrombosis of other parts of aorta: Secondary | ICD-10-CM | POA: Insufficient documentation

## 2015-11-01 DIAGNOSIS — E1165 Type 2 diabetes mellitus with hyperglycemia: Secondary | ICD-10-CM | POA: Insufficient documentation

## 2015-11-01 DIAGNOSIS — M4316 Spondylolisthesis, lumbar region: Secondary | ICD-10-CM | POA: Insufficient documentation

## 2015-11-01 DIAGNOSIS — Z6836 Body mass index (BMI) 36.0-36.9, adult: Secondary | ICD-10-CM | POA: Insufficient documentation

## 2015-11-01 DIAGNOSIS — Z7982 Long term (current) use of aspirin: Secondary | ICD-10-CM | POA: Insufficient documentation

## 2015-11-01 DIAGNOSIS — Z794 Long term (current) use of insulin: Secondary | ICD-10-CM | POA: Diagnosis not present

## 2015-11-01 DIAGNOSIS — R1011 Right upper quadrant pain: Secondary | ICD-10-CM | POA: Diagnosis not present

## 2015-11-01 DIAGNOSIS — Z87891 Personal history of nicotine dependence: Secondary | ICD-10-CM | POA: Insufficient documentation

## 2015-11-01 DIAGNOSIS — E785 Hyperlipidemia, unspecified: Secondary | ICD-10-CM

## 2015-11-01 DIAGNOSIS — G4733 Obstructive sleep apnea (adult) (pediatric): Secondary | ICD-10-CM | POA: Diagnosis not present

## 2015-11-01 DIAGNOSIS — E669 Obesity, unspecified: Secondary | ICD-10-CM | POA: Insufficient documentation

## 2015-11-01 DIAGNOSIS — R748 Abnormal levels of other serum enzymes: Secondary | ICD-10-CM | POA: Insufficient documentation

## 2015-11-01 DIAGNOSIS — E119 Type 2 diabetes mellitus without complications: Secondary | ICD-10-CM

## 2015-11-01 DIAGNOSIS — K859 Acute pancreatitis without necrosis or infection, unspecified: Secondary | ICD-10-CM | POA: Diagnosis not present

## 2015-11-01 DIAGNOSIS — Z720 Tobacco use: Secondary | ICD-10-CM

## 2015-11-01 DIAGNOSIS — K858 Other acute pancreatitis without necrosis or infection: Secondary | ICD-10-CM

## 2015-11-01 DIAGNOSIS — E7800 Pure hypercholesterolemia, unspecified: Secondary | ICD-10-CM | POA: Insufficient documentation

## 2015-11-01 LAB — CBC WITH DIFFERENTIAL/PLATELET
BASOS ABS: 0 10*3/uL (ref 0.0–0.1)
Basophils Relative: 0 %
EOS ABS: 0.2 10*3/uL (ref 0.0–0.7)
EOS PCT: 2 %
HCT: 33.9 % — ABNORMAL LOW (ref 36.0–46.0)
Hemoglobin: 11.3 g/dL — ABNORMAL LOW (ref 12.0–15.0)
LYMPHS ABS: 3.3 10*3/uL (ref 0.7–4.0)
Lymphocytes Relative: 37 %
MCH: 30.4 pg (ref 26.0–34.0)
MCHC: 33.3 g/dL (ref 30.0–36.0)
MCV: 91.1 fL (ref 78.0–100.0)
Monocytes Absolute: 1 10*3/uL (ref 0.1–1.0)
Monocytes Relative: 11 %
Neutro Abs: 4.3 10*3/uL (ref 1.7–7.7)
Neutrophils Relative %: 50 %
PLATELETS: 341 10*3/uL (ref 150–400)
RBC: 3.72 MIL/uL — AB (ref 3.87–5.11)
RDW: 14.7 % (ref 11.5–15.5)
WBC: 8.8 10*3/uL (ref 4.0–10.5)

## 2015-11-01 LAB — URINALYSIS, ROUTINE W REFLEX MICROSCOPIC
Bilirubin Urine: NEGATIVE
Glucose, UA: NEGATIVE mg/dL
Hgb urine dipstick: NEGATIVE
KETONES UR: NEGATIVE mg/dL
Leukocytes, UA: NEGATIVE
Nitrite: NEGATIVE
PROTEIN: NEGATIVE mg/dL
Specific Gravity, Urine: 1.012 (ref 1.005–1.030)
pH: 7 (ref 5.0–8.0)

## 2015-11-01 LAB — I-STAT CHEM 8, ED
BUN: 10 mg/dL (ref 6–20)
CALCIUM ION: 1.14 mmol/L (ref 1.13–1.30)
Chloride: 102 mmol/L (ref 101–111)
Creatinine, Ser: 0.8 mg/dL (ref 0.44–1.00)
GLUCOSE: 161 mg/dL — AB (ref 65–99)
HCT: 37 % (ref 36.0–46.0)
HEMOGLOBIN: 12.6 g/dL (ref 12.0–15.0)
Potassium: 4 mmol/L (ref 3.5–5.1)
Sodium: 144 mmol/L (ref 135–145)
TCO2: 31 mmol/L (ref 0–100)

## 2015-11-01 MED ORDER — MORPHINE SULFATE (PF) 4 MG/ML IV SOLN
4.0000 mg | Freq: Once | INTRAVENOUS | Status: AC
  Administered 2015-11-01: 4 mg via INTRAMUSCULAR
  Filled 2015-11-01: qty 1

## 2015-11-01 MED ORDER — DIATRIZOATE MEGLUMINE & SODIUM 66-10 % PO SOLN: 15.0000 mL | Freq: Once | ORAL | Status: DC

## 2015-11-01 MED ORDER — SODIUM CHLORIDE 0.9 % IV BOLUS (SEPSIS)
500.0000 mL | Freq: Once | INTRAVENOUS | Status: AC
  Administered 2015-11-02: 500 mL via INTRAVENOUS

## 2015-11-01 MED ORDER — IOPAMIDOL (ISOVUE-300) INJECTION 61%
100.0000 mL | Freq: Once | INTRAVENOUS | Status: AC | PRN
  Administered 2015-11-01: 100 mL via INTRAVENOUS

## 2015-11-01 NOTE — ED Notes (Signed)
Attempt x 1 to initiate IV without success.  Fleet Contras, RN to attempt.

## 2015-11-01 NOTE — ED Notes (Signed)
IV attempted by Ric with [place] without success.  Dr. Dalene Seltzer now attempting with [place].

## 2015-11-01 NOTE — Progress Notes (Signed)
Patient ID: Cynthia Fuller, female   DOB: 04-04-1946, 70 y.o.   MRN: [id number]   Location:  [facility] Room Number: 154-B Place of Service:  SNF (31)   CODE STATUS: Full Code  Allergies  Allergen Reactions  . Neomycin Hives    Chief Complaint  Patient presents with  . Hospitalization Follow-up    Hospital Follow up    HPI:  She had been hospitalized originally for lower extremity weakness. There was no clear etiology found during the hospitalization for her weakness. Her strength did improve during her hospitalization. Per the hospital discharge she does have home stressors which could be contributing to her medical issues. She was admitted to [facility]; however; she left AMA; as she wanted to smoke. She was seen in the ED for back pain and constipation. She is here for short term rehab with her goal to return back home. She is on nicotine patch for her smoking. She is very constipated.    Past Medical History  Diagnosis Date  . Diabetes mellitus without complication (HCC)   . Hypertension   . Sleep apnea   . Hypercholesteremia   . Rheumatoid arthritis [facility])     Past Surgical History  Procedure Laterality Date  . Ablation      Social History   Social History  . Marital Status: Divorced    Spouse Name: N/A  . Number of Children: N/A  . Years of Education: N/A   Occupational History  . Not on file.   Social History Main Topics  . Smoking status: Smoker, Current Status Unknown    Types: Cigarettes  . Smokeless tobacco: Former Neurosurgeon    Quit date: 10/20/2015  . Alcohol Use: Not on file  . Drug Use: Not on file  . Sexual Activity: Not on file   Other Topics Concern  . Not on file   Social History Narrative   Family History  Problem Relation Age of Onset  . Diabetes Brother   . Kidney disease Brother       VITAL SIGNS BP 123/77 mmHg  Pulse 85  Temp(Src) 97.9 F (36.6 C) (Oral)  Resp 18  Ht 6' (1.829 m)  Wt 193 lb  (87.544 kg)  BMI 26.17 kg/m2  SpO2 97%  Patient's Medications  New Prescriptions   No medications on file  Previous Medications   ALPRAZOLAM (XANAX) 0.5 MG TABLET    Take 1 tablet (0.5 mg total) by mouth 3 (three) times daily as needed for anxiety.   ASPIRIN EC 81 MG TABLET    Take 81 mg by mouth daily.   ATORVASTATIN (LIPITOR) 40 MG TABLET    Take 40 mg by mouth daily.   BENAZEPRIL-HYDROCHLORTHIAZIDE (LOTENSIN HCT) 20-12.5 MG TABLET    Take 1 tablet by mouth daily.   FISH OIL-CHOLECALCIFEROL (FISH OIL + D3 PO)    Take 1 capsule by mouth daily.   IBUPROFEN (ADVIL,MOTRIN) 200 MG TABLET    Take 200 mg by mouth every 6 (six) hours as needed (pain).    INSULIN ASPART (NOVOLOG) 100 UNIT/ML INJECTION    Inject 0-10 Units into the skin 3 (three) times daily before meals. 100-150=5 units 151-200=8 units >=201 = 10 units   INSULIN GLARGINE (LANTUS) 100 UNIT/ML INJECTION    Inject 35 Units into the skin at bedtime.   IPRATROPIUM-ALBUTEROL (COMBIVENT RESPIMAT) 20-100 MCG/ACT AERS RESPIMAT    Inhale 1 puff into the lungs every 6 (six) hours as needed for wheezing.  MULTIPLE VITAMINS-MINERALS (MULTIVITAMIN WITH MINERALS) TABLET    Take 1 tablet by mouth daily.   NICOTINE (NICODERM CQ - DOSED IN MG/24 HOURS) 21 MG/24HR PATCH    Place 1 patch (21 mg total) onto the skin daily.   POLYETHYLENE GLYCOL (MIRALAX) PACKET    Take 17 g by mouth daily.  Modified Medications   No medications on file  Discontinued Medications   PREDNISONE (DELTASONE) 5 MG TABLET    Take 10 mg by mouth daily. Reported on 11/01/2015     SIGNIFICANT DIAGNOSTIC EXAMS  10-21-15; acute abdomen with chest x-ray; Mild congestion.  No focal consolidation. Constipation.  No bowel obstruction.  10-21-15: ct of head: No acute intracranial pathology.  10-21-15: mri of lumbar spine: 1. No acute osseous abnormality in the lumbar spine. Transitional anatomy. 2. Occasional abnormal signal in the bilateral posterior paraspinal muscles. Legrand Rams  this is posttraumatic muscle contusion or myositis given the scattered and patchy morphology. No associated fluid collection. 3. Mild degenerative spondylolisthesis at L3-L4 and L4-L5 with associated mild disc and moderate facet degeneration. No lumbar spinal or lateral recess stenosis. There is mild L3 and L4 foraminal stenosis.  10-21-15: mri of brain: Limited single sequence MRI of the brain: No acute ischemia.  10-22-15: mra of brain: Negative intracranial MRA.  10-28-15: lumbar x-ray; No acute bony abnormalities. Degenerative changes in the lower lumbar spine  10-29-15: acute abdomen and chest x-ray; No acute abnormalities.  LABS REVIEWED:   10-21-15; wbc 9.8; hgb 12.8; hct 39.0; mcv 91.5; plt 315; glucose 261; bun 16; creat 0.82; k+ 3.7; na++143; liver normal albumin 4.0; vit D 38.3; CK 260; vit B 12: 588 10-22-15: hgb a1c 8.1 10-24-15: glucose 161; bun 11; creat 0.75; k+ 3.9; na++140  10-29-15: wbc 7.0; hgb 12.8; hct 38.8; mcv 92.2 plt 345; glucose 103; bun 16; creat 0.66; k+ 4.0; na++ 143; liver normal albumin 4.0; lipase 23    Review of Systems  Constitutional: Negative for malaise/fatigue.  Respiratory: Negative for cough and shortness of breath.   Cardiovascular: Negative for chest pain, palpitations and leg swelling.  Gastrointestinal: has constipation; abdominal pain .  Musculoskeletal: Negative for myalgias, back pain and joint pain.  Skin: Negative.   Neurological: Negative for dizziness.  Psychiatric/Behavioral: The patient is not nervous/anxious.     Physical Exam  Constitutional: No distress.  Eyes: Conjunctivae are normal.  Neck: Neck supple. No JVD present. No thyromegaly present.  Cardiovascular: Normal rate, regular rhythm and intact distal pulses.   Respiratory: Effort normal and breath sounds normal. No respiratory distress. She has no wheezes.  GI: Soft. Diffuse tenderness abdomen is distended; bowel sounds are hypoactive  Musculoskeletal: She exhibits no  edema.  Able to move all extremities  Has RA deformities in bilateral hands Has slight head shaking present   Lymphadenopathy:    She has no cervical adenopathy.  Neurological: She is alert.  Skin: Skin is warm and dry. She is not diaphoretic.  Psychiatric: She has a normal mood and affect.      ASSESSMENT/ PLAN:  1. RA: has bilateral hand deformities: her prednisone was stopped; is currently not on medications; will monitor  2. Diabetes: hgb a1c is 8.1; will continue lantus 35 units nightly; metformin xr 500 mg daily will change her novolog SSI to: 100-150 5 units; 151-200 8 units; >200 10 units with meals  3. Hypertension: will continue lisinopril-hct 20/12.5 mg daily  Asa 81 mg daily   4. Dyslipidemia: will continue lipitor 40 mg daily  5.  OSA: is on cpap at nightly will continue combivent respimat 1 puff every 6 hours as needed  6. Allergic rhinitis: is currently not on medications; will monitor   7. Anxiety: will continue xanax 0.5 mg three times daily as needed  8. Smoker: will begin nicotine patch 21 mg daily   9. Lower extremity weakness: will continue therapy as directed to improve upon strength and independence with her adl's.   10. Constipation; will continue miralax daily; will give her a fleets enema now and will give her 1 bottle of mag citrate   Will check bmp; mag  in the AM.    Time spent with patient  50   minutes >50% time spent counseling; reviewing medical record; tests; labs; and developing future plan of care          Synthia Innocent NP [facility]  Contact [phone] Monday through Friday 8am- 5pm  After hours call [phone]

## 2015-11-01 NOTE — ED Provider Notes (Signed)
CSN: [id number]     Arrival date & time 11/01/15  1709 History   First MD Initiated Contact with Patient 11/01/15 1809     Chief Complaint  Patient presents with  . Constipation     (Consider location/radiation/quality/duration/timing/severity/associated sxs/prior Treatment) HPI   Patient presents with pain on both sides of abdomen, started when she was here Friday.  When here Thursday she was given pain medication regimen, shots, pills, went home woke up with pain.  Have not had a bowel movement in 7 days tomorrow.  Thinks the constipation is causing the pain.  Not taking narcotic pain medications at this time.  Primary concern is constipation today.  10/10 pain.    Tried miralax, enema, stool softners without help.  Passing flatus.  No nausea, no vomiting.    Past Medical History  Diagnosis Date  . Diabetes mellitus without complication (HCC)   . Hypertension   . Sleep apnea   . Hypercholesteremia   . Rheumatoid arthritis [facility])    Past Surgical History  Procedure Laterality Date  . Ablation     Family History  Problem Relation Age of Onset  . Diabetes Brother   . Kidney disease Brother    Social History  Substance Use Topics  . Smoking status: Smoker, Current Status Unknown    Types: Cigarettes  . Smokeless tobacco: Former Neurosurgeon    Quit date: 10/20/2015  . Alcohol Use: None   OB History    No data available     Review of Systems  Constitutional: Negative for fever.  HENT: Negative for sore throat.   Eyes: Negative for visual disturbance.  Respiratory: Negative for cough and shortness of breath.   Cardiovascular: Negative for chest pain.  Gastrointestinal: Positive for nausea, abdominal pain and constipation. Negative for vomiting, diarrhea and rectal pain.  Genitourinary: Positive for flank pain. Negative for difficulty urinating.  Musculoskeletal: Negative for back pain and neck pain.  Skin: Negative for rash.  Neurological: Negative for syncope,  light-headedness and headaches.      Allergies  Neomycin  Home Medications   Prior to Admission medications   Medication Sig Start Date End Date Taking? Authorizing Provider  ALPRAZolam Prudy Feeler) 0.5 MG tablet Take 1 tablet (0.5 mg total) by mouth 3 (three) times daily as needed for anxiety. Patient taking differently: Take 0.5 mg by mouth as needed for anxiety. 1 tablet QHS PRN 10/29/15  Yes Rolland Porter, MD  aspirin EC 81 MG tablet Take 81 mg by mouth daily.   Yes Historical Provider, MD  atorvastatin (LIPITOR) 40 MG tablet Take 40 mg by mouth daily.   Yes Historical Provider, MD  benazepril-hydrochlorthiazide (LOTENSIN HCT) 20-12.5 MG tablet Take 1 tablet by mouth daily.   Yes Historical Provider, MD  ibuprofen (ADVIL,MOTRIN) 200 MG tablet Take 200 mg by mouth every 6 (six) hours as needed (pain).    Yes Historical Provider, MD  insulin aspart (NOVOLOG) 100 UNIT/ML injection Inject 0-10 Units into the skin 3 (three) times daily before meals. 100-150=5 units 151-200=8 units >=201 = 10 units Patient taking differently: Inject as per sliding scale: if 0-149=0; 150-600=5, subcutaneously before meals 10/26/15  Yes [name], NP  insulin glargine (LANTUS) 100 UNIT/ML injection Inject 35 Units into the skin at bedtime.   Yes Historical Provider, MD  Ipratropium-Albuterol (COMBIVENT RESPIMAT) 20-100 MCG/ACT AERS respimat Inhale 1 puff into the lungs every 6 (six) hours as needed for wheezing.   Yes Historical Provider, MD  magnesium citrate SOLN Take 1  Bottle by mouth once.   Yes Historical Provider, MD  magnesium hydroxide (MILK OF MAGNESIA) 400 MG/5ML suspension Take 30 mLs by mouth every other day as needed for mild constipation.   Yes Historical Provider, MD  Multiple Vitamins-Minerals (MULTIVITAMIN WITH MINERALS) tablet Take 1 tablet by mouth daily.   Yes Historical Provider, MD  nicotine (NICODERM CQ - DOSED IN MG/24 HOURS) 21 mg/24hr patch Place 1 patch (21 mg total) onto the skin daily.  10/26/15  Yes [name], NP  Omega-3 Fatty Acids (FISH OIL) 1000 MG CAPS Take 1,000 mg by mouth daily.   Yes Historical Provider, MD  polyethylene glycol (MIRALAX) packet Take 17 g by mouth daily. 10/29/15  Yes Jeffrey Hedges, PA-C  promethazine (PHENERGAN) 25 MG tablet Take 25 mg by mouth every 6 (six) hours as needed for nausea or vomiting.   Yes Historical Provider, MD  sodium phosphate (FLEET) enema Place 1 enema rectally once. follow package directions   Yes Historical Provider, MD   BP 145/88 mmHg  Pulse 82  Temp(Src) 99 F (37.2 C) (Oral)  Resp 18  Ht 5\' 2"  (1.575 m)  Wt 193 lb (87.544 kg)  BMI 35.29 kg/m2  SpO2 93% Physical Exam  Constitutional: She is oriented to person, place, and time. She appears well-developed and well-nourished. No distress.  HENT:  Head: Normocephalic and atraumatic.  Eyes: Conjunctivae and EOM are normal.  Neck: Normal range of motion.  Cardiovascular: Normal rate, regular rhythm, normal heart sounds and intact distal pulses.  Exam reveals no gallop and no friction rub.   No murmur heard. Pulmonary/Chest: Effort normal and breath sounds normal. No respiratory distress. She has no wheezes. She has no rales.  Abdominal: Soft. She exhibits distension. There is tenderness in the right upper quadrant, epigastric area and left upper quadrant. There is positive Murphy's sign. There is no guarding.  Musculoskeletal: She exhibits no edema or tenderness.  Neurological: She is alert and oriented to person, place, and time.  Skin: Skin is warm and dry. No rash noted. She is not diaphoretic. No erythema.  Nursing note and vitals reviewed.   ED Course  Procedures (including critical care time) Labs Review Labs Reviewed  COMPREHENSIVE METABOLIC PANEL - Abnormal; Notable for the following:    CO2 34 (*)    Glucose, Bld 164 (*)    Total Bilirubin 0.2 (*)    All other components within normal limits  LIPASE, BLOOD - Abnormal; Notable for the following:     Lipase 189 (*)    All other components within normal limits  CBC WITH DIFFERENTIAL/PLATELET - Abnormal; Notable for the following:    RBC 3.72 (*)    Hemoglobin 11.3 (*)    HCT 33.9 (*)    All other components within normal limits  URINALYSIS, ROUTINE W REFLEX MICROSCOPIC (NOT AT [facility]) - Abnormal; Notable for the following:    APPearance CLOUDY (*)    All other components within normal limits  I-STAT CHEM 8, ED - Abnormal; Notable for the following:    Glucose, Bld 161 (*)    All other components within normal limits  URINE CULTURE    Imaging Review Ct Abdomen Pelvis W Contrast  11/02/2015  CLINICAL DATA:  Acute onset of lower extremity weakness. Lower back pain and constipation. Initial encounter. EXAM: CT ABDOMEN AND PELVIS WITH CONTRAST TECHNIQUE: Multidetector CT imaging of the abdomen and pelvis was performed using the standard protocol following bolus administration of intravenous contrast. CONTRAST:  11/04/2015 ISOVUE-300 IOPAMIDOL (  ISOVUE-300) INJECTION 61% COMPARISON:  Abdominal radiograph performed 10/29/2015 FINDINGS: The visualized lung bases are clear. Scattered coronary artery calcifications are seen. The liver and spleen are unremarkable in appearance. The gallbladder is within normal limits. The pancreas and adrenal glands are unremarkable. The kidneys are unremarkable in appearance. There is no evidence of hydronephrosis. No renal or ureteral stones are seen. No perinephric stranding is appreciated. No free fluid is identified. The small bowel is unremarkable in appearance. The stomach is within normal limits. No acute vascular abnormalities are seen. Relatively diffuse calcification is seen along the abdominal aorta and its branches, with associated mural thrombus but no significant luminal narrowing. The appendix is normal in caliber, without evidence of appendicitis. The colon is largely filled with stool, though the sigmoid colon is decompressed. Minimal diverticulosis is noted  along the descending and sigmoid colon, without evidence of diverticulitis. The bladder is mildly distended and grossly unremarkable. The patient is status post hysterectomy. No suspicious adnexal masses are seen. The left ovary is unremarkable in appearance. No inguinal lymphadenopathy is seen. No acute osseous abnormalities are identified. Facet disease is noted along the lumbar spine. IMPRESSION: 1. Large amount of stool noted within the colon, compatible with constipation. The sigmoid colon is largely decompressed. 2. Relatively diffuse calcification along the abdominal aorta and its branches, with associated mural thrombus but no significant luminal narrowing. 3. Scattered coronary artery calcifications seen. 4. Minimal diverticulosis along the descending and sigmoid colon, without evidence of diverticulitis. Electronically Signed   By: Roanna Raider M.D.   On: 11/02/2015 01:28   I have personally reviewed and evaluated these images and lab results as part of my medical decision-making.   EKG Interpretation None      MDM   Final diagnoses:  Other acute pancreatitis  Constipation due to slow transit  Epigastric pain   70 year old female with a history of diabetes, hypertension, hypercholesterolemia, rheumatoid arthritis, recent admission with workup for concern of bilateral lower extremity weakness, presents with concern for abdominal pain and constipation. Initially attempted disimpaction, however no stool present in the rectum. EMR severe pain, labs and CT abdomen and pelvis are which were significant for pancreatitis with a lipase of 189. CT abdomen and pelvis showed no sign of gallbladder disease, pt denies hx of etoh use. Pt given morphine for pain secondary to pancreatitis. Given mag citrate for constipation. CT shows mural thrombosis of aorta without significant luminal narrowing.  Pt to be transferred to [facility] for continued care by the Internal Medicine teaching service for  pancreatitis.    Alvira Monday, MD 11/02/15 [phone]

## 2015-11-01 NOTE — ED Notes (Signed)
Pt presents to [facility] via [facility] from [employer]. Patient fell at home and was discharged to [facility] for strengthening and PT. She was started on opiates for pain and now has not had a bowel movement in 6 days. Nursing has tried suppositories, enemas, and PO medications without relief. Patient complains of discomfort.

## 2015-11-01 NOTE — ED Notes (Signed)
Patient transported to CT 

## 2015-11-02 ENCOUNTER — Inpatient Hospital Stay (HOSPITAL_COMMUNITY): Payer: Medicare Other

## 2015-11-02 DIAGNOSIS — I513 Intracardiac thrombosis, not elsewhere classified: Secondary | ICD-10-CM

## 2015-11-02 DIAGNOSIS — K5901 Slow transit constipation: Secondary | ICD-10-CM | POA: Diagnosis not present

## 2015-11-02 DIAGNOSIS — K5900 Constipation, unspecified: Secondary | ICD-10-CM

## 2015-11-02 DIAGNOSIS — E119 Type 2 diabetes mellitus without complications: Secondary | ICD-10-CM

## 2015-11-02 DIAGNOSIS — R1011 Right upper quadrant pain: Secondary | ICD-10-CM | POA: Insufficient documentation

## 2015-11-02 DIAGNOSIS — F17201 Nicotine dependence, unspecified, in remission: Secondary | ICD-10-CM

## 2015-11-02 DIAGNOSIS — R748 Abnormal levels of other serum enzymes: Secondary | ICD-10-CM

## 2015-11-02 DIAGNOSIS — E785 Hyperlipidemia, unspecified: Secondary | ICD-10-CM

## 2015-11-02 DIAGNOSIS — Z794 Long term (current) use of insulin: Secondary | ICD-10-CM

## 2015-11-02 DIAGNOSIS — R0989 Other specified symptoms and signs involving the circulatory and respiratory systems: Secondary | ICD-10-CM | POA: Diagnosis not present

## 2015-11-02 DIAGNOSIS — K859 Acute pancreatitis without necrosis or infection, unspecified: Secondary | ICD-10-CM | POA: Diagnosis present

## 2015-11-02 LAB — BASIC METABOLIC PANEL
Anion gap: 5 (ref 5–15)
CO2: 28 mmol/L (ref 22–32)
CREATININE: 0.69 mg/dL (ref 0.44–1.00)
Calcium: 8.5 mg/dL — ABNORMAL LOW (ref 8.9–10.3)
Chloride: 105 mmol/L (ref 101–111)
GFR calc Af Amer: >60 mL/min (ref >60)
GLUCOSE: 250 mg/dL — AB (ref 65–99)
POTASSIUM: 3.8 mmol/L (ref 3.5–5.1)
SODIUM: 138 mmol/L (ref 135–145)

## 2015-11-02 LAB — LIPASE, BLOOD: LIPASE: 189 U/L — AB (ref 11–51)

## 2015-11-02 LAB — GLUCOSE, CAPILLARY
GLUCOSE-CAPILLARY: 98 mg/dL (ref 65–99)
Glucose-Capillary: 216 mg/dL — ABNORMAL HIGH (ref 65–99)
Glucose-Capillary: 181 mg/dL — ABNORMAL HIGH (ref 65–99)
Glucose-Capillary: 241 mg/dL — ABNORMAL HIGH (ref 65–99)
Glucose-Capillary: 169 mg/dL — ABNORMAL HIGH (ref 65–99)

## 2015-11-02 LAB — COMPREHENSIVE METABOLIC PANEL
ALBUMIN: 3.5 g/dL (ref 3.5–5.0)
ALK PHOS: 45 U/L (ref 38–126)
ALT: 18 U/L (ref 14–54)
ANION GAP: 5 (ref 5–15)
AST: 18 U/L (ref 15–41)
BILIRUBIN TOTAL: 0.2 mg/dL — AB (ref 0.3–1.2)
BUN: 11 mg/dL (ref 6–20)
CALCIUM: 9.2 mg/dL (ref 8.9–10.3)
CO2: 34 mmol/L — ABNORMAL HIGH (ref 22–32)
Chloride: 104 mmol/L (ref 101–111)
Creatinine, Ser: 0.77 mg/dL (ref 0.44–1.00)
Glucose, Bld: 164 mg/dL — ABNORMAL HIGH (ref 65–99)
POTASSIUM: 4.2 mmol/L (ref 3.5–5.1)
Sodium: 143 mmol/L (ref 135–145)
TOTAL PROTEIN: 7.4 g/dL (ref 6.5–8.1)

## 2015-11-02 LAB — TRIGLYCERIDES: Triglycerides: 71 mg/dL (ref <150)

## 2015-11-02 LAB — TSH: TSH: 1.705 u[IU]/mL (ref 0.350–4.500)

## 2015-11-02 MED ORDER — NICOTINE 21 MG/24HR TD PT24
21.0000 mg | MEDICATED_PATCH | Freq: Every day | TRANSDERMAL | Status: DC
  Filled 2015-11-02 (×2): qty 1

## 2015-11-02 MED ORDER — MORPHINE SULFATE (PF) 2 MG/ML IV SOLN: 2.0000 mg | INTRAVENOUS | Status: DC | PRN

## 2015-11-02 MED ORDER — IPRATROPIUM-ALBUTEROL 20-100 MCG/ACT IN AERS: 1.0000 | INHALATION_SPRAY | Freq: Four times a day (QID) | RESPIRATORY_TRACT | Status: DC | PRN

## 2015-11-02 MED ORDER — ACETAMINOPHEN 500 MG PO TABS
500.0000 mg | ORAL_TABLET | Freq: Four times a day (QID) | ORAL | Status: DC | PRN
  Administered 2015-11-03 (×3): 500 mg via ORAL
  Filled 2015-11-02 (×3): qty 1

## 2015-11-02 MED ORDER — INSULIN ASPART 100 UNIT/ML ~~LOC~~ SOLN
0.0000 [IU] | SUBCUTANEOUS | Status: DC
  Administered 2015-11-02: 5 [IU] via SUBCUTANEOUS
  Administered 2015-11-02 (×2): 3 [IU] via SUBCUTANEOUS
  Administered 2015-11-02: 5 [IU] via SUBCUTANEOUS
  Administered 2015-11-03: 11 [IU] via SUBCUTANEOUS
  Administered 2015-11-03: 2 [IU] via SUBCUTANEOUS
  Administered 2015-11-03 (×2): 3 [IU] via SUBCUTANEOUS

## 2015-11-02 MED ORDER — BISACODYL 10 MG RE SUPP: 10.0000 mg | Freq: Every day | RECTAL | Status: DC | PRN

## 2015-11-02 MED ORDER — SODIUM CHLORIDE 0.9 % IV SOLN
INTRAVENOUS | Status: DC
  Administered 2015-11-02 (×2): via INTRAVENOUS

## 2015-11-02 MED ORDER — HYDROCHLOROTHIAZIDE 12.5 MG PO CAPS
12.5000 mg | ORAL_CAPSULE | Freq: Every day | ORAL | Status: DC
  Administered 2015-11-02 – 2015-11-03 (×2): 12.5 mg via ORAL
  Filled 2015-11-02 (×2): qty 1

## 2015-11-02 MED ORDER — INSULIN GLARGINE 100 UNIT/ML ~~LOC~~ SOLN
25.0000 [IU] | Freq: Every day | SUBCUTANEOUS | Status: DC
  Administered 2015-11-02: 25 [IU] via SUBCUTANEOUS
  Filled 2015-11-02 (×2): qty 0.25

## 2015-11-02 MED ORDER — HYDROMORPHONE HCL 1 MG/ML IJ SOLN
1.0000 mg | INTRAMUSCULAR | Status: DC | PRN
  Filled 2015-11-02: qty 1

## 2015-11-02 MED ORDER — IPRATROPIUM-ALBUTEROL 0.5-2.5 (3) MG/3ML IN SOLN: 3.0000 mL | Freq: Four times a day (QID) | RESPIRATORY_TRACT | Status: DC | PRN

## 2015-11-02 MED ORDER — MORPHINE SULFATE (PF) 4 MG/ML IV SOLN: 4.0000 mg | Freq: Once | INTRAVENOUS | Status: DC

## 2015-11-02 MED ORDER — MAGNESIUM CITRATE PO SOLN
1.0000 | Freq: Once | ORAL | Status: AC
  Administered 2015-11-02: 1 via ORAL
  Filled 2015-11-02: qty 296

## 2015-11-02 MED ORDER — BENAZEPRIL HCL 40 MG PO TABS
20.0000 mg | ORAL_TABLET | Freq: Every day | ORAL | Status: DC
  Administered 2015-11-02 – 2015-11-03 (×2): 20 mg via ORAL
  Filled 2015-11-02 (×2): qty 1

## 2015-11-02 MED ORDER — FLEET ENEMA 7-19 GM/118ML RE ENEM
1.0000 | ENEMA | Freq: Every day | RECTAL | Status: DC | PRN
  Filled 2015-11-02: qty 1

## 2015-11-02 MED ORDER — BENAZEPRIL-HYDROCHLOROTHIAZIDE 20-12.5 MG PO TABS: 1.0000 | ORAL_TABLET | Freq: Every day | ORAL | Status: DC

## 2015-11-02 MED ORDER — PROMETHAZINE HCL 25 MG PO TABS: 12.5000 mg | ORAL_TABLET | Freq: Four times a day (QID) | ORAL | Status: DC | PRN

## 2015-11-02 MED ORDER — ENOXAPARIN SODIUM 40 MG/0.4ML ~~LOC~~ SOLN
40.0000 mg | SUBCUTANEOUS | Status: DC
  Administered 2015-11-02 – 2015-11-03 (×2): 40 mg via SUBCUTANEOUS
  Filled 2015-11-02 (×3): qty 0.4

## 2015-11-02 MED ORDER — OXYCODONE HCL 5 MG PO TABS: 5.0000 mg | ORAL_TABLET | Freq: Four times a day (QID) | ORAL | Status: DC | PRN

## 2015-11-02 NOTE — ED Notes (Signed)
Pt assisted to sit on BS per pt's request.

## 2015-11-02 NOTE — ED Notes (Signed)
Report given to Lowell Guitar, RN

## 2015-11-02 NOTE — ED Notes (Signed)
P requested to use bedpan.  Pt assisted with bedpan.  Linens wet & changed.

## 2015-11-02 NOTE — Progress Notes (Signed)
Subjective: Patient reports having a large bowel movement his morning following the magnesium citrate. Reports large volume soft BM but did not look to see if any blood was present. Reports an additional BM later this morning as well. Says her abdominal pain is improved since having a BM and just has some soreness across her upper abdomen underneath her rib cage.   Objective: Vital signs in last 24 hours: Filed Vitals:   11/02/15 0400 11/02/15 0444 11/02/15 0502 11/02/15 0546  BP: 132/80  154/81   Pulse: 84  87   Temp:    99.3 F (37.4 C)  TempSrc:    Oral  Resp: 23  20   Height:  5\' 2"  (1.575 m)    Weight:  200 lb 1.6 oz (90.765 kg)    SpO2: 95%  99%    Weight change:   Intake/Output Summary (Last 24 hours) at 11/02/15 0724 Last data filed at 11/02/15 11/04/15  Gross per 24 hour  Intake      0 ml  Output    350 ml  Net   -350 ml     Physical Exam  Constitutional: She is oriented to person, place, and time. Obese woman, laying in bed on her side in no acute distress Cardiovascular: Normal rate and regular rhythm. Exam reveals no gallop and no friction rub.  No murmur heard. Pulmonary/Chest: Effort normal. No respiratory distress. She has no wheezes. She has no rales. She exhibits no tenderness.  Abdominal: Obese tense distended abdomen, tender to palpation in the RUQ, epigastric, LUQ, BS+, no guarding or rebound present. +Murphy's sign. Musculoskeletal: She exhibits no edema or tenderness.  Strength 5/5 equal bilateral upper extremities, lower extremity strength 4+/5 bilaterally, but dorsiflexion and plantarflexion b/l feet is 5/5 and able to move both extremities. RA changes of both hands.  Neurological: She is alert and oriented to person, place, and time.  Skin: Skin is warm and dry. She is not diaphoretic.  Psychiatric: She has a normal mood and affect.   Medications: I have reviewed the patient's current medications. Scheduled Meds: . enoxaparin (LOVENOX) injection   40 mg Subcutaneous Q24H  . insulin aspart  0-15 Units Subcutaneous Q4H  . nicotine  21 mg Transdermal Daily   Continuous Infusions: . sodium chloride 150 mL/hr at 11/02/15 0606   PRN Meds:.bisacodyl, HYDROmorphone (DILAUDID) injection, ipratropium-albuterol, promethazine, sodium phosphate Assessment/Plan:  Constipation with abdominal pain: Patient with large bowel movement this morning after receiving magnesium citrate. Large amount of stool seen on CT abdomen without SBO. Abdominal pain is improving after having the bowel movement. Reports abdomen is sore underneath her rib cage. Tender to palpation in RUQ, epigastric and LUQ on exam. +Murphy's sign. TSH was wnl.  -Dulcolax suppository  Elevated Lipase: Lipase 189 with non-specific abdominal pain and constipation. She denies alcohol use (reports she quit 9 years ago). CT abdomen reads an unremarkable pancreas and normal gallbladder. LFTS without an obstructive pattern. Denies any history of gallstones. Does have a +Murphy's sign on exam this morning. Symptoms have improved following having a BM this morning. She reports nausea but denies any recent emesis that would account for the elevated lipase. Have a low suspicion this is a true pancreatitis and her abdominal pain and symptoms are most likely secondary to her constipation with improvement following bowel movement today. No clear risk factors for pancreatitis and has a CT abdomen showing a normal pancreas and gallbladder. She did have +Murphy's sign on exam today. Possible contributing factors include  prednisone use last week for her RA, benazepril-HCTZ. Recently started on atorvastatin. TG wnl. Will get abdominal US to rule out gallstone (female, obese, >40 years ago with murphy's sign). Will advance diet as tolerated. Re-check lipase in am for possible spurious lab.  -Advance diet as tolerated -D/C IVF -Phenergan prn -f/u BMP -Repeat lipase in am -RUQ [place] -Hold atorvastatin  Aortic Mural  Thrombus: Seen on CT abdomen along with diffuse calcification of the abdominal aorta and branches. No significant luminal narrowing or AAA seen. Vascular surgery consulted. Said there is evidence of aortoiliac occlusive disease with palpable pulse in her feet with no further vascular work up indicated at this time. Smoking cessation will be important.   HTN: Patient on benazepril-hctz 20-12.5 mg daily. Held on admission. BP initially 140s systolic. Elevated overnight and currently 150-160s systolic. -Restart home meds  A7OL: Hgb A1C 8.1 on 10/22/2015. On Lantus 35u qhs and Novolog SSI at home. -SSI-M -Lantus 25 units qhs  HLD: Recently started on Atorvastatin. Possibly related to lower extremity weakness, however not thought to contribute during last admission. -Hold Atorvastatin per above  Tobacco use: Reports quitting last Thursday. Discussed importance of cessation. Reports no intentions of resuming smoking.  -Nicotine patch  Diet: Advance as tolerated  DVT ppx: Lovenox  Code: FULL (will discuss with family for future goals of care)  Dispo: Disposition is deferred at this time, awaiting improvement of current medical problems.  Anticipated discharge in approximately 1 day(s).   The patient does have a current PCP Ileana Ladd, MD) and does need an [facility] hospital follow-up appointment after discharge.  The patient does not have transportation limitations that hinder transportation to clinic appointments.  .Services Needed at time of discharge: Y = Yes, Blank = No PT:   OT:   RN:   Equipment:   Other:     LOS: 0 days   Valentino Nose, MD IMTS PGY-1 [phone] 11/02/2015, 7:24 AM

## 2015-11-02 NOTE — Progress Notes (Signed)
Admission note:  Arrival Method: Pt arrived to unit with [facility] Orientation: Alert and oriented x 4 Telemetry: N/A Assessment: Completed, see flowsheets Skin: Dry and intact. Skin assessed with Rozann Lesches, RN IV: Left upper arm IV. Site clean, dry and intact Pain: Pt states pain is 10/10 in her abdomen. MD notified.  Tubes: N/A Safety Measures: Bed in lowest position, call light within reach, non-slip socks placed, bed alarm activated  Fall Prevention Safety Plan: Reviewed with patient Admission Screening: Completed 6700 Orientation: Patient has been oriented to the unit, staff and to the room. MD notified of patient's arrival to unit. Orders have been reviewed and implemented. Call light within reach, will continue to monitor.   Feliciana Rossetti, RN, BSN

## 2015-11-02 NOTE — Progress Notes (Signed)
Utilization review complete. Gerrick Ray RN CCM Case Mgmt phone [phone] 

## 2015-11-02 NOTE — Care Management Obs Status (Addendum)
MEDICARE OBSERVATION STATUS NOTIFICATION   Patient Details  Name: Cynthia Fuller MRN: [id number] Date of Birth: July 21, 1945   Medicare Observation Status Notification Given:  Yes    Elliot Cousin, RN 11/02/2015, 5:08 PM

## 2015-11-02 NOTE — H&P (Signed)
Date: 11/02/2015               Patient Name:  Cynthia Fuller MRN: [id number]  DOB: 1945-09-23 Age / Sex: 70 y.o., female   PCP: Ileana Ladd, MD         Medical Service: Internal Medicine Teaching Service         Attending Physician: Dr. Inez Catalina, MD    First Contact: Dr. Valentino Nose Pager: [phone]  Second Contact: Dr. Hyacinth Meeker Pager: [phone]       After Hours (After 5p/  First Contact Pager: [phone]  weekends / holidays): Second Contact Pager: [phone]   Chief Complaint: Constipation, abdominal pain  History of Present Illness:  Ms. Cynthia Fuller is a 70 year old lady with PMH of T2DM, HTN, HLD, OSA, and RA with recent hospital admission for lower extremity weakness who presents with abdominal pain and constipation. Patient states that she has not had a bowel movement in 7 days. She says her pain began last Thursday (10/28/15), involving her back and abdomen. She was seen at [facility] ED on the 18th and 19th for this and was given toradol, oxycodone, and lidocaine patch without relief. Imaging from prior admission and ED visits including DG abdomen and chest, DG lumbar spine, MR MRA head, MR brain, MR lumbar spine, DG lumbar spine, CT head were without acute pathology. SNF placement was arranged from the ED.   Patient returned to [facility] ED yesterday with current complaints of constipation and abdominal pain. She says she has tried miralax, enema, stool softeners, and suppositories without relief. She is passing flatus. She feels bloated and notes her abdomen seems distended. She has not taken narcotics in the interim, instead using Ibuprofen and tylenol for pain. She reports associated nausea, decreased appetite and fluid intake. She reports a non-productive cough and continued bilateral leg weakness. She denies any chest pain, SOB, fever, chills, diaphoresis, diarrhea, vomiting, dysuria.  In the ED, disimpaction was attempted, but no stool was present in the rectum. Lab  work revealed a lipase of 189. A CT abdomen/pelvis was obtained which showed a large amount of stool in the colon, diffuse calcification of the abdominal aorta and branches with associated mural thrombus without significant luminal narrowing. Minimal diverticulosis w/o evidence for diverticulitis is also seen. Pancrease unremarkable on CT read. She was given morphine for pain and mag citrate for constipation. Transferred to [facility] for admission under IMTS.   Social Hx: No alcohol in 9 years, quit smoking "cold [place]" last week, no illicit drug use  Familiy Hx: Father died of pancreatic cancer, Mother died of kidney failure, CHF in brother, DM and kidney disease in brother and sister   Meds: Current Facility-Administered Medications  Medication Dose Route Frequency Provider Last Rate Last Dose  . 0.9 %  sodium chloride infusion   Intravenous Continuous Su Hoff, MD 150 mL/hr at 11/02/15 0606    . bisacodyl (DULCOLAX) suppository 10 mg  10 mg Rectal Daily PRN Carly J Rivet, MD      . enoxaparin (LOVENOX) injection 40 mg  40 mg Subcutaneous Q24H Carly J Rivet, MD      . HYDROmorphone (DILAUDID) injection 1 mg  1 mg Intravenous Q4H PRN Carly J Rivet, MD      . insulin aspart (novoLOG) injection 0-15 Units  0-15 Units Subcutaneous Q4H Su Hoff, MD   5 Units at 11/02/15 0605  . ipratropium-albuterol (DUONEB) 0.5-2.5 (3) MG/3ML nebulizer solution 3  mL  3 mL Nebulization Q6H PRN Carly J Rivet, MD      . nicotine (NICODERM CQ - dosed in mg/24 hours) patch 21 mg  21 mg Transdermal Daily Carly J Rivet, MD      . promethazine (PHENERGAN) tablet 12.5 mg  12.5 mg Oral Q6H PRN Carly J Rivet, MD      . sodium phosphate (FLEET) 7-19 GM/118ML enema 1 enema  1 enema Rectal Daily PRN Su Hoff, MD        Allergies: Allergies as of 11/01/2015 - Review Complete 11/01/2015  Allergen Reaction Noted  . Neomycin Hives 10/21/2015   Past Medical History  Diagnosis Date  . Diabetes mellitus  without complication (HCC)   . Hypertension   . Sleep apnea   . Hypercholesteremia   . Rheumatoid arthritis [facility])    Past Surgical History  Procedure Laterality Date  . Ablation     Family History  Problem Relation Age of Onset  . Diabetes Brother   . Kidney disease Brother    Social History   Social History  . Marital Status: Divorced    Spouse Name: N/A  . Number of Children: N/A  . Years of Education: N/A   Occupational History  . Not on file.   Social History Main Topics  . Smoking status: Smoker, Current Status Unknown    Types: Cigarettes  . Smokeless tobacco: Former Neurosurgeon    Quit date: 10/20/2015  . Alcohol Use: Not on file  . Drug Use: Not on file  . Sexual Activity: Not on file   Other Topics Concern  . Not on file   Social History Narrative    Review of Systems: Review of Systems  Constitutional: Negative for fever, chills, weight loss, malaise/fatigue and diaphoresis.  HENT: Negative for sore throat.   Respiratory: Positive for cough. Negative for sputum production and shortness of breath.   Cardiovascular: Negative for chest pain, palpitations and leg swelling.  Gastrointestinal: Positive for nausea, abdominal pain and constipation. Negative for vomiting, diarrhea, blood in stool and melena.  Genitourinary: Negative for dysuria and hematuria.  Musculoskeletal: Positive for joint pain. Negative for myalgias, back pain and neck pain.  Neurological: Positive for weakness. Negative for dizziness, tingling, sensory change, focal weakness, seizures, loss of consciousness and headaches.  Psychiatric/Behavioral: Negative for substance abuse.     Physical Exam: Blood pressure 154/81, pulse 87, temperature 99.3 F (37.4 C), temperature source Oral, resp. rate 20, height 5\' 2"  (1.575 m), weight 200 lb 1.6 oz (90.765 kg), SpO2 99 %. Physical Exam  Constitutional: She is oriented to person, place, and time. She appears well-developed and well-nourished.    Obese woman, laying in bed on her side in intermittent distress  HENT:  Head: Normocephalic and atraumatic.  Eyes: EOM are normal.  Neck: Neck supple.  Cardiovascular: Normal rate and regular rhythm.  Exam reveals no gallop and no friction rub.   No murmur heard. Pulmonary/Chest: Effort normal. No respiratory distress. She has no wheezes. She has no rales. She exhibits no tenderness.  Abdominal:  Obese tense distended abdomen, tender to palpation in the RUQ, epigastric, LUQ, LLQ areas, bowel sounds present, hypoactive right quadrants and more active left abdominal regions  Musculoskeletal: She exhibits no edema or tenderness.  Strength 5/5 equal bilateral upper extremities, difficult to assess lower extremity strength due to patient position and pain, but dorsiflexion and plantarflexion b/l feet is 5/5 and able to move both extremities. RA changes of both hands.  Neurological:  She is alert and oriented to person, place, and time.  Skin: Skin is warm and dry. She is not diaphoretic.  Psychiatric: She has a normal mood and affect.     Lab results: Basic Metabolic Panel:  Recent Labs  81/19/14 2331 11/01/15 2342  NA 143 144  K 4.2 4.0  CL 104 102  CO2 34*  --   GLUCOSE 164* 161*  BUN 11 10  CREATININE 0.77 0.80  CALCIUM 9.2  --    Liver Function Tests:  Recent Labs  11/01/15 2331  AST 18  ALT 18  ALKPHOS 45  BILITOT 0.2*  PROT 7.4  ALBUMIN 3.5    Recent Labs  11/01/15 2331  LIPASE 189*   No results for input(s): AMMONIA in the last 72 hours. CBC:  Recent Labs  11/01/15 2331 11/01/15 2342  WBC 8.8  --   NEUTROABS 4.3  --   HGB 11.3* 12.6  HCT 33.9* 37.0  MCV 91.1  --   PLT 341  --    Cardiac Enzymes: No results for input(s): CKTOTAL, CKMB, CKMBINDEX, TROPONINI in the last 72 hours. BNP: No results for input(s): PROBNP in the last 72 hours. D-Dimer: No results for input(s): DDIMER in the last 72 hours. CBG:  Recent Labs  11/02/15 0555  GLUCAP  216*   Hemoglobin A1C: No results for input(s): HGBA1C in the last 72 hours. Fasting Lipid Panel: No results for input(s): CHOL, HDL, LDLCALC, TRIG, CHOLHDL, LDLDIRECT in the last 72 hours. Thyroid Function Tests: No results for input(s): TSH, T4TOTAL, FREET4, T3FREE, THYROIDAB in the last 72 hours. Anemia Panel: No results for input(s): VITAMINB12, FOLATE, FERRITIN, TIBC, IRON, RETICCTPCT in the last 72 hours. Coagulation: No results for input(s): LABPROT, INR in the last 72 hours. Urine Drug Screen: Drugs of Abuse  No results found for: LABOPIA, COCAINSCRNUR, LABBENZ, AMPHETMU, THCU, LABBARB  Alcohol Level: No results for input(s): ETH in the last 72 hours. Urinalysis:  Recent Labs  11/01/15 2305  COLORURINE YELLOW  LABSPEC 1.012  PHURINE 7.0  GLUCOSEU NEGATIVE  HGBUR NEGATIVE  BILIRUBINUR NEGATIVE  KETONESUR NEGATIVE  PROTEINUR NEGATIVE  NITRITE NEGATIVE  LEUKOCYTESUR NEGATIVE    Imaging results:  Ct Abdomen Pelvis W Contrast  11/02/2015  CLINICAL DATA:  Acute onset of lower extremity weakness. Lower back pain and constipation. Initial encounter. EXAM: CT ABDOMEN AND PELVIS WITH CONTRAST TECHNIQUE: Multidetector CT imaging of the abdomen and pelvis was performed using the standard protocol following bolus administration of intravenous contrast. CONTRAST:  ISOVUE-300 IOPAMIDOL (ISOVUE-300) INJECTION 61% COMPARISON:  Abdominal radiograph performed 10/29/2015 FINDINGS: The visualized lung bases are clear. Scattered coronary artery calcifications are seen. The liver and spleen are unremarkable in appearance. The gallbladder is within normal limits. The pancreas and adrenal glands are unremarkable. The kidneys are unremarkable in appearance. There is no evidence of hydronephrosis. No renal or ureteral stones are seen. No perinephric stranding is appreciated. No free fluid is identified. The small bowel is unremarkable in appearance. The stomach is within normal limits. No  acute vascular abnormalities are seen. Relatively diffuse calcification is seen along the abdominal aorta and its branches, with associated mural thrombus but no significant luminal narrowing. The appendix is normal in caliber, without evidence of appendicitis. The colon is largely filled with stool, though the sigmoid colon is decompressed. Minimal diverticulosis is noted along the descending and sigmoid colon, without evidence of diverticulitis. The bladder is mildly distended and grossly unremarkable. The patient is status post hysterectomy. No suspicious  adnexal masses are seen. The left ovary is unremarkable in appearance. No inguinal lymphadenopathy is seen. No acute osseous abnormalities are identified. Facet disease is noted along the lumbar spine. IMPRESSION: 1. Large amount of stool noted within the colon, compatible with constipation. The sigmoid colon is largely decompressed. 2. Relatively diffuse calcification along the abdominal aorta and its branches, with associated mural thrombus but no significant luminal narrowing. 3. Scattered coronary artery calcifications seen. 4. Minimal diverticulosis along the descending and sigmoid colon, without evidence of diverticulitis. Electronically Signed   By: Roanna Raider M.D.   On: 11/02/2015 01:28    Other results: EKG: n/a.  Assessment & Plan by Problem: Principal Problem:   Acute pancreatitis Active Problems:   Lower extremity weakness   Diabetes mellitus type 2, insulin dependent (HCC)   Hypertension   Rheumatoid arthritis (HCC)   Dyslipidemia associated with type 2 diabetes mellitus (HCC)   Constipation due to slow transit   Acute Pancreatitis: Lipase 189 with non-specific abdominal pain and constipation. She denies alcohol use. CT abdomen reads an unremarkable pancreas and normal gallbladder. LFTS without an obstructive pattern. Possible contributing factors include prednisone use last week for her RA, benazepril-HCTZ,  hypertriglyceridemia, or idiopathic. Will hold oral intake and treat with IVF and pain control. -NPO -NS @ 150 mL/hr -Dilaudid 1 mg IV q4h prn -Phenergan prn -Check Triglycerides -f/u BMP   Constipation: Large amount of stool seen on CT abdomen without SBO. Disimpaction was unsuccessful in the ED as no stool in the rectum. Patient reports trying "everything" at home including suppositories, laxatives, enemas. Possibly related to recent narcotic administration. Will avoid oral medications in setting of pancreatitis. -Dulcolax suppository -Fleet enema -Check TSH   Aortic Mural Thrombus: Seen on CT abdomen along with diffuse calcification of the abdominal aorta and branches. No significant luminal narrowing or AAA seen. Possibly contributing to patient's lower extremity weakness? No signs of limb ischemia on exam. May need TTE/TEE for further evaluation or vascular surgery consult for recommendations. May also need to consider anticoagulation, however there is concern for hemorrhagic conversion of pancreatitis. -Consider Echo for further eval -Consider VVS consultation -Consider anticoagulation  T2DM: Hgb A1C 8.1 on 10/22/2015. On Lantus 35u qhs and Novolog SSI at home. -SSI-M  HLD: Recently started on Atorvastatin. Possibly related to lower extremity weakness, however not thought to contribute during last admission. -Hold Atorvastatin  Tobacco use: Quit cold [place] last week. -Nicotine patch  Diet: NPO  DVT ppx: Lovenox  Code: FULL (will discuss with family for future goals of care)  Dispo: Disposition is deferred at this time, awaiting improvement of current medical problems. Anticipated discharge in approximately 2-4 day(s).   The patient does have a current PCP Ileana Ladd, MD) and does not need an [facility] hospital follow-up appointment after discharge.  The patient does not have transportation limitations that hinder transportation to clinic appointments.  Signed: Darreld Mclean, MD 11/02/2015, 6:45 AM

## 2015-11-02 NOTE — Consult Note (Signed)
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 Consult is from Dr. Burton Apley.  HPI: Cynthia Fuller is a 70 y.o. female, who was admitted on 11/02/2015 with abdominal pain and constipation. Her workup included a CT of the abdomen and pelvis. This showed laminated thrombus in the aorta and for this reason vascular surgery was consult.  The patient denies any history of claudication, rest pain, or nonhealing ulcers. She is fairly active and walks her dog every day. She has noted some leg weakness although this has resolved. She denies any significant back pain.  Her risk factors for peripheral vascular disease include diabetes, hypertension, hypercholesterolemia, and a family history of premature cardiovascular disease. In addition she smokes 10 cigarettes a day for over 40 years but quit this Thursday.  Past Medical History  Diagnosis Date  . Diabetes mellitus without complication (HCC)   . Hypertension   . Sleep apnea   . Hypercholesteremia   . Rheumatoid arthritis (HCC)     Family History  Problem Relation Age of Onset  . Diabetes Brother   . Kidney disease Brother     SOCIAL HISTORY: Social History   Social History  . Marital Status: Divorced    Spouse Name: N/A  . Number of Children: N/A  . Years of Education: N/A   Occupational History  . Not on file.   Social History Main Topics  . Smoking status: Smoker, Current Status Unknown    Types: Cigarettes  . Smokeless tobacco: Former Neurosurgeon    Quit date: 10/20/2015  . Alcohol Use: Not on file  . Drug Use: Not on file  . Sexual Activity: Not on file   Other Topics Concern  . Not on file   Social History Narrative    Allergies  Allergen Reactions  . Neomycin Hives    Current Facility-Administered Medications  Medication Dose Route Frequency Provider Last Rate Last Dose  . 0.9 %  sodium chloride  infusion   Intravenous Continuous Su Hoff, MD 150 mL/hr at 11/02/15 0606    . acetaminophen (TYLENOL) tablet 500 mg  500 mg Oral Q6H PRN Tasrif Ahmed, MD      . bisacodyl (DULCOLAX) suppository 10 mg  10 mg Rectal Daily PRN Carly J Rivet, MD      . enoxaparin (LOVENOX) injection 40 mg  40 mg Subcutaneous Q24H Su Hoff, MD   40 mg at 11/02/15 0759  . insulin aspart (novoLOG) injection 0-15 Units  0-15 Units Subcutaneous Q4H Su Hoff, MD   3 Units at 11/02/15 0759  . ipratropium-albuterol (DUONEB) 0.5-2.5 (3) MG/3ML nebulizer solution 3 mL  3 mL Nebulization Q6H PRN Carly J Rivet, MD      . nicotine (NICODERM CQ - dosed in mg/24 hours) patch 21 mg  21 mg Transdermal Daily Carly J Rivet, MD      . oxyCODONE (Oxy IR/ROXICODONE) immediate release tablet 5 mg  5 mg Oral Q6H PRN Tasrif Ahmed, MD      . promethazine (PHENERGAN) tablet 12.5 mg  12.5 mg Oral Q6H PRN Carly J Rivet, MD      . sodium phosphate (FLEET) 7-19 GM/118ML enema 1 enema  1 enema Rectal Daily PRN Carly J Rivet, MD        REVIEW OF SYSTEMS:  [X]  denotes positive finding, [ ]  denotes negative finding Cardiac  Comments:  Chest pain or chest  pressure:    Shortness of breath upon exertion:    Short of breath when lying flat:    Irregular heart rhythm:        Vascular    Pain in calf, thigh, or hip brought on by ambulation:    Pain in feet at night that wakes you up from your sleep:     Blood clot in your veins:    Leg swelling:         Pulmonary  Asthma and sleep apnea.   Oxygen at home:    Productive cough:     Wheezing:         Neurologic    Sudden weakness in arms or legs:     Sudden numbness in arms or legs:     Sudden onset of difficulty speaking or slurred speech:    Temporary loss of vision in one eye:     Problems with dizziness:         Gastrointestinal    Blood in stool:     Vomited blood:         Genitourinary    Burning when urinating:     Blood in urine:        Psychiatric    Major  depression:         Hematologic    Bleeding problems:    Problems with blood clotting too easily:        Skin    Rashes or ulcers:        Constitutional    Fever or chills:      PHYSICAL EXAM: Filed Vitals:   11/02/15 0444 11/02/15 0502 11/02/15 0546 11/02/15 0843  BP:  154/81  161/75  Pulse:  87  89  Temp:   99.3 F (37.4 C) 98.2 F (36.8 C)  TempSrc:   Oral Oral  Resp:  20  20  Height: 5\' 2"  (1.575 m)     Weight: 200 lb 1.6 oz (90.765 kg)     SpO2:  99%  99%    GENERAL: The patient is a well-nourished female, in no acute distress. The vital signs are documented above. CARDIAC: There is a regular rate and rhythm.  VASCULAR: I do not protect carotid bruits. On the right side, I can palpate a femoral  Pulse, popliteal pulses, and dorsalis pedis pulse. I cannot palpate a posterior tibial pulse. On the left side, I can palpate femoral, popliteal, and dorsalis pedis pulse. I cannot palpate a posterior tibial pulse. She has mild bilateral lower extremity swelling. PULMONARY: There is good air exchange bilaterally without wheezing or rales. ABDOMEN: oB's and difficult to assess for an aneurysm. MUSCULOSKELETAL: There are no major deformities or cyanosis. NEUROLOGIC: No focal weakness or paresthesias are detected. SKIN: There are no ulcers or rashes noted. PSYCHIATRIC: The patient has a normal affect.  DATA:   CT ABDOMEN/ PELVIS: I have reviewed the images of her CT of the abdomen and pelvis. She does have some diffuse aortoiliac occlusive disease but no focal narrowing. There is also disease in the descending thoracic aorta. There is laminated thrombus in the descending thoracic aorta and also infrarenal aorta. However this is not flow limiting and appears chronic.  MEDICAL ISSUES:  LAMINATED THROMBUS INFRARENAL AORTA: I think that this is an incidental finding. She does have evidence of aortoiliac occlusive disease but has palpable pulses in her feet. I do not think any  further vascular workup is indicated at this time. We have discussed the importance  of tobacco cessation and she tells me that she quit this Thursday with no intentions of restarting. I encouraged her to stay as active as possible. We've also discussed the importance of nutrition. I'll be happy to see her back in anytime if any vascular issues arise. Vascular surgery will be available as needed.   Waverly Ferrari [facility] [place] [phone]

## 2015-11-03 DIAGNOSIS — K5901 Slow transit constipation: Principal | ICD-10-CM

## 2015-11-03 LAB — BASIC METABOLIC PANEL
ANION GAP: 7 (ref 5–15)
BUN: 5 mg/dL — ABNORMAL LOW (ref 6–20)
CALCIUM: 9.1 mg/dL (ref 8.9–10.3)
CO2: 30 mmol/L (ref 22–32)
Chloride: 102 mmol/L (ref 101–111)
Creatinine, Ser: 0.7 mg/dL (ref 0.44–1.00)
GFR calc Af Amer: >60 mL/min (ref >60)
GLUCOSE: 121 mg/dL — AB (ref 65–99)
POTASSIUM: 3.8 mmol/L (ref 3.5–5.1)
Sodium: 139 mmol/L (ref 135–145)

## 2015-11-03 LAB — GLUCOSE, CAPILLARY
GLUCOSE-CAPILLARY: 136 mg/dL — AB (ref 65–99)
GLUCOSE-CAPILLARY: 315 mg/dL — AB (ref 65–99)
Glucose-Capillary: 116 mg/dL — ABNORMAL HIGH (ref 65–99)
Glucose-Capillary: 153 mg/dL — ABNORMAL HIGH (ref 65–99)
Glucose-Capillary: 197 mg/dL — ABNORMAL HIGH (ref 65–99)

## 2015-11-03 LAB — LIPASE, BLOOD: LIPASE: 22 U/L (ref 11–51)

## 2015-11-03 LAB — URINE CULTURE

## 2015-11-03 MED ORDER — MAGNESIUM CITRATE PO SOLN: 1.0000 | Freq: Once | ORAL | Status: DC

## 2015-11-03 MED ORDER — BISACODYL 10 MG RE SUPP: 10.0000 mg | Freq: Every day | RECTAL | Status: DC | PRN

## 2015-11-03 NOTE — Progress Notes (Signed)
Cynthia Fuller to be D/C'd Skilled nursing facility per MD order.  Discussed prescriptions and follow up appointments with the patient. Prescriptions given to patient, medication list explained in detail. Pt verbalized understanding.    Medication List    STOP taking these medications        magnesium hydroxide 400 MG/5ML suspension  Commonly known as:  MILK OF MAGNESIA     sodium phosphate enema  Commonly known as:  FLEET      TAKE these medications        ALPRAZolam 0.5 MG tablet  Commonly known as:  XANAX  Take 1 tablet (0.5 mg total) by mouth 3 (three) times daily as needed for anxiety.     aspirin EC 81 MG tablet  Take 81 mg by mouth daily.     atorvastatin 40 MG tablet  Commonly known as:  LIPITOR  Take 40 mg by mouth daily.     benazepril-hydrochlorthiazide 20-12.5 MG tablet  Commonly known as:  LOTENSIN HCT  Take 1 tablet by mouth daily.     bisacodyl 10 MG suppository  Commonly known as:  DULCOLAX  Place 1 suppository (10 mg total) rectally daily as needed for moderate constipation.     COMBIVENT RESPIMAT 20-100 MCG/ACT Aers respimat  Generic drug:  Ipratropium-Albuterol  Inhale 1 puff into the lungs every 6 (six) hours as needed for wheezing.     Fish Oil 1000 MG Caps  Take 1,000 mg by mouth daily.     ibuprofen 200 MG tablet  Commonly known as:  ADVIL,MOTRIN  Take 200 mg by mouth every 6 (six) hours as needed (pain).     insulin aspart 100 UNIT/ML injection  Commonly known as:  novoLOG  Inject 0-10 Units into the skin 3 (three) times daily before meals. 100-150=5 units 151-200=8 units >=201 = 10 units     insulin glargine 100 UNIT/ML injection  Commonly known as:  LANTUS  Inject 35 Units into the skin at bedtime.     magnesium citrate Soln  Take 296 mLs (1 Bottle total) by mouth once. For constipation.     multivitamin with minerals tablet  Take 1 tablet by mouth daily.     nicotine 21 mg/24hr patch  Commonly known as:  NICODERM CQ - dosed in  mg/24 hours  Place 1 patch (21 mg total) onto the skin daily.     polyethylene glycol packet  Commonly known as:  MIRALAX  Take 17 g by mouth daily.     promethazine 25 MG tablet  Commonly known as:  PHENERGAN  Take 25 mg by mouth every 6 (six) hours as needed for nausea or vomiting.        Filed Vitals:   11/03/15 0945 11/03/15 1755  BP: 150/80 146/76  Pulse: 84 87  Temp: 98.5 F (36.9 C) 98.7 F (37.1 C)  Resp: 18 18    Skin clean, dry and intact without evidence of skin break down, no evidence of skin tears noted. IV catheter discontinued intact. Site without signs and symptoms of complications. Dressing and pressure applied. Pt denies pain at this time. No complaints noted.  An After Visit Summary was printed and given to the patient. Patient escorted via stretcher, and D/C home via ambulance.  Janeann Forehand BSN, RN

## 2015-11-03 NOTE — Progress Notes (Signed)
Subjective: Patient reports having several bowel movements yesterday. None yet this morning. Reports abdominal pain is improving but still has diffuse tenderness/soreness. She was sitting up in the chair and reports this is the first time she has been able to sit up in the past week. Tolerating clear liquid diet. Denies any nausea or vomiting. Requesting to advance diet this morning.   Objective: Vital signs in last 24 hours: Filed Vitals:   11/02/15 0843 11/02/15 1858 11/02/15 1937 11/03/15 0436  BP: 161/75 154/80 156/72 141/78  Pulse: 89 92 89 81  Temp: 98.2 F (36.8 C) 98.6 F (37 C) 99.6 F (37.6 C) 99.1 F (37.3 C)  TempSrc: Oral Oral Oral Oral  Resp: 20  19 16   Height:      Weight:   197 lb 6.4 oz (89.54 kg)   SpO2: 99% 98% 97% 98%   Weight change: 4 lb 6.4 oz (1.996 kg)  Intake/Output Summary (Last 24 hours) at 11/03/15 1022 Last data filed at 11/03/15 0606  Gross per 24 hour  Intake    720 ml  Output   1600 ml  Net   -880 ml     Physical Exam  Constitutional: She is oriented to person, place, and time. Obese woman, sitting up in chair eating breakfast  Cardiovascular: Normal rate and regular rhythm. Exam reveals no gallop and no friction rub.No murmur heard. Pulmonary/Chest: Effort normal. No respiratory distress. No wheezes.  Abdominal: Obese tense distended abdomen, diffusely mildly tender to palpation. No guarding or rebound. BS+. Strength 5/5 equal bilateral upper extremities, lower extremity strength 4+/5 bilaterally, but dorsiflexion and plantarflexion b/l feet is 5/5 and able to move both extremities. RA changes of both hands.  Neurological: She is alert and oriented to person, place, and time.  Skin: Skin is warm and dry. She is not diaphoretic.  Psychiatric: She has a normal mood and affect.   Medications: I have reviewed the patient's current medications. Scheduled Meds: . benazepril  20 mg Oral Daily   And  . hydrochlorothiazide  12.5 mg Oral Daily   . enoxaparin (LOVENOX) injection  40 mg Subcutaneous Q24H  . insulin aspart  0-15 Units Subcutaneous Q4H  . insulin glargine  25 Units Subcutaneous QHS  . nicotine  21 mg Transdermal Daily   Continuous Infusions:   PRN Meds:.acetaminophen, bisacodyl, ipratropium-albuterol, promethazine, sodium phosphate Assessment/Plan:  Constipation with abdominal pain: 8 bowel movements documented yesterday. Patient reports abdominal pain improivng but not completely resolved. Mainly complaining of diffuse soreness under her rib cages. RUQ 11/05/15 unremarkable yesterday. Tolerating PO intake.  -Dulcolax suppository -Advance diet to full -PT consult pending  Elevated Lipase: Lipase 22 this morning. Suspect spurious lab result on admission with lipase of 189. She is tolerating clear liquid diet. Abdominal pain improving after having bowel movements yesterday. CT abdomen was unremarkable pancreas and normal gallbladder. RUQ US unremarkable with no stones. Will advance her diet today and if tolerating can discharge. Denies any nausea or vomiting.  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 No significant luminal narrowing or AAA seen. Vascular surgery consulted. Said there is evidence of aortoiliac occlusive disease with palpable pulse in her feet with no further vascular work up indicated at this time. Smoking cessation will be important.   HTN: BP 141/78 this morning. Patient on benazepril-hctz 20-12.5 mg daily.  -Continue home meds  T2DM: CBG 136 this morning. Hgb  A1C 8.1 on 10/22/2015. On Lantus 35u qhs and Novolog SSI at home. -SSI-M -Lantus 25 units qhs  HLD: Recently started on Atorvastatin. Possibly related to lower extremity weakness, however not thought to contribute during last admission. -Restart home Atorvastatin per above  Tobacco use: Reports quitting last Thursday. Discussed  importance of cessation. Reports no intentions of resuming smoking.  -Nicotine patch  Diet: Advance as tolerated  DVT ppx: Lovenox  Code: FULL (will discuss with family for future goals of care)  Dispo: Anticipate discharge pending PT evaluation  The patient does have a current PCP Ileana Ladd, MD) and does need an [facility] hospital follow-up appointment after discharge.  The patient does not have transportation limitations that hinder transportation to clinic appointments.  .Services Needed at time of discharge: Y = Yes, Blank = No PT:   OT:   RN:   Equipment:   Other:     LOS: 1 day   Valentino Nose, MD IMTS PGY-1 [phone] 11/03/2015, 10:22 AM

## 2015-11-03 NOTE — Progress Notes (Signed)
Pharmacist Student Provided - Patient Medication Education Education Best boy Service Patient  Education: Educated the patient on some of current medications, specifically lovenox. Patient stated she did not want nicotine patches and was reminded about resources to assist smoking cessation. Reviewed side effects with the patient. Answered the patients medication specific questions. These medications are on the patient's current med list and are subject to change upon discharge.  Dola Argyle  P4 PharmD Candidate

## 2015-11-03 NOTE — Clinical Social Work Placement (Signed)
   CLINICAL SOCIAL WORK PLACEMENT  NOTE  Date:  11/03/2015  Patient Details  Name: Cynthia Fuller MRN: [id number] Date of Birth: [birth year]  Clinical Social Work is seeking post-discharge placement for this patient at the Skilled  Nursing Facility level of care (*CSW will initial, date and re-position this form in  chart as items are completed):  No   Patient/family provided with [facility] Clinical Social Work Department's list of facilities offering this level of care within the geographic area requested by the patient (or if unable, by the patient's family).  No   Patient/family informed of their freedom to choose among providers that offer the needed level of care, that participate in Medicare, Medicaid or managed care program needed by the patient, have an available bed and are willing to accept the patient.  No   Patient/family informed of [facility]'s ownership interest in [facility] and [facility], as well as of the fact that they are under no obligation to receive care at these facilities.  PASRR submitted to EDS on       PASRR number received on       Existing PASRR number confirmed on 11/03/15     FL2 transmitted to all facilities in geographic area requested by pt/family on 11/03/15     FL2 transmitted to all facilities within larger geographic area on       Patient informed that his/her managed care company has contracts with or will negotiate with certain facilities, including the following:        Yes   Patient/family informed of bed offers received.  Patient chooses bed at [facility]     Physician recommends and patient chooses bed at      Patient to be transferred to [facility] on 11/03/15.  Patient to be transferred to facility by [facility]     Patient family notified on 11/03/15 of transfer.  Name of family member notified:  Daughter at bedside     PHYSICIAN       Additional Comment:     _______________________________________________ Mearl Latin, LCSWA 11/03/2015, 5:37 PM

## 2015-11-03 NOTE — Plan of Care (Signed)
Problem: Acute Rehab PT Goals(only PT should resolve) Goal: Patient Will Transfer Sit To/From Stand From all surfaces

## 2015-11-03 NOTE — Progress Notes (Signed)
  Date: 11/03/2015  Patient name: Cynthia Fuller  Medical record number: [id number]  Date of birth: 04-Dec-1945   This patient has been seen and the plan of care was discussed with the house staff. Please see Dr. Bonney Roussel note for complete details. I concur with his findings with the following additions/corrections: Likely can discharge tomorrow to SNF if still doing well.   Inez Catalina, MD 11/03/2015, 4:27 PM

## 2015-11-03 NOTE — Evaluation (Signed)
Physical Therapy Evaluation Patient Details Name: Cynthia Fuller MRN: [id number] DOB: May 22, 1946 Today's Date: 11/03/2015   History of Present Illness  70 yo female with constipation and noted aortic laminated thrombus was admitted to [facility], PMHx:  DM, HTN, tobacco use, previous independent walker with quad cane  Clinical Impression  Pt is prepared to go to SNF and recommend based on her limited tolerance for gait, her assistance needed for transfers and her poor control of bed mob.  Will see her as a pt in hospital to progress mobility and gait as tolerated and work on lower body strengthening as able.    Follow Up Recommendations SNF    Equipment Recommendations  None recommended by PT (has a quad cane and rw)    Recommendations for Other Services Rehab consult     Precautions / Restrictions Precautions Precautions: Fall Restrictions Weight Bearing Restrictions: No      Mobility  Bed Mobility Overal bed mobility: Needs Assistance Bed Mobility: Sidelying to Sit;Sit to Sidelying   Sidelying to sit: Min assist     Sit to sidelying: Min assist (to elevate legs) General bed mobility comments: railing used and HOB elevated slightly to get OOB  Transfers Overall transfer level: Needs assistance Equipment used: Rolling walker (2 wheeled);1 person hand held assist Transfers: Sit to/from [employer] Sit to Stand: Mod assist;Max assist (from lower surface, min assist from bed) Stand pivot transfers: Min assist       General transfer comment: assisted in BR mod max from lower surface to stand   Ambulation/Gait Ambulation/Gait assistance: Min assist Ambulation Distance (Feet): 50 Feet Assistive device: Rolling walker (2 wheeled) Gait Pattern/deviations: Step-through pattern;Shuffle;Wide base of support Gait velocity: reduced Gait velocity interpretation: Below normal speed for age/gender    Stairs            Wheelchair Mobility    Modified Rankin  (Stroke Patients Only)       Balance Overall balance assessment: Needs assistance Sitting-balance support: Feet supported Sitting balance-Leahy Scale: Good     Standing balance support: Bilateral upper extremity supported Standing balance-Leahy Scale: Fair Standing balance comment: less than fair to stand with dynamic tasks such as in BR                             Pertinent Vitals/Pain Pain Assessment: No/denies pain    Home Living Family/patient expects to be discharged to:: Private residence Living Arrangements: Alone Available Help at Discharge: Family;Available PRN/intermittently Type of Home: House Home Access: Stairs to enter Entrance Stairs-Rails: Right;Left;Can reach both Entrance Stairs-Number of Steps: 3 Home Layout: One level Home Equipment: Cane - quad      Prior Function Level of Independence: Independent with assistive device(s)         Comments: reports she drives, grocery shops (holding cart); rarely uses quad cane (if not feeling well)     Hand Dominance   Dominant Hand: Right    Extremity/Trunk Assessment   Upper Extremity Assessment: Generalized weakness (difficulty with cleaning herself in the bathroom)           Lower Extremity Assessment: Generalized weakness      Cervical / Trunk Assessment: Normal  Communication   Communication: HOH  Cognition Arousal/Alertness: Awake/alert Behavior During Therapy: WFL for tasks assessed/performed Overall Cognitive Status: Within Functional Limits for tasks assessed  General Comments General comments (skin integrity, edema, etc.): Pt is demonstrating some safety issues for going home alone, has intermittent family help.  Will recommend SNF for short stay and progress home quickly.    Exercises        Assessment/Plan    PT Assessment Patient needs continued PT services  PT Diagnosis Difficulty walking   PT Problem List Decreased  strength;Decreased range of motion;Decreased activity tolerance;Decreased balance;Decreased mobility;Decreased coordination;Decreased cognition;Decreased knowledge of use of DME;Decreased safety awareness;Decreased knowledge of precautions;Cardiopulmonary status limiting activity;Obesity  PT Treatment Interventions DME instruction;Gait training;Stair training;Functional mobility training;Therapeutic activities;Therapeutic exercise;Balance training;Patient/family education   PT Goals (Current goals can be found in the Care Plan section) Acute Rehab PT Goals Patient Stated Goal: regain strength to return home alone PT Goal Formulation: With patient Time For Goal Achievement: 11/17/15 Potential to Achieve Goals: Good    Frequency Min 3X/week   Barriers to discharge Inaccessible home environment;Decreased caregiver support (steps to enter house and daughter not living with her)      Co-evaluation               End of Session Equipment Utilized During Treatment: Gait belt Activity Tolerance: Patient limited by fatigue Patient left: in bed;with call bell/phone within reach;with bed alarm set      Functional Assessment Tool Used: clinical judgement Functional Limitation: Mobility: Walking and moving around Mobility: Walking and Moving Around Current Status (J1791): At least 20 percent but less than 40 percent impaired, limited or restricted Mobility: Walking and Moving Around Goal Status [id number]): At least 1 percent but less than 20 percent impaired, limited or restricted    Time: 1045-1120 PT Time Calculation (min) (ACUTE ONLY): 35 min   Charges:   PT Evaluation $PT Eval Low Complexity: 1 Procedure PT Treatments $Gait Training: 8-22 mins   PT G Codes:   PT G-Codes **NOT FOR INPATIENT CLASS** Functional Assessment Tool Used: clinical judgement Functional Limitation: Mobility: Walking and moving around Mobility: Walking and Moving Around Current Status (V9480): At least 20  percent but less than 40 percent impaired, limited or restricted Mobility: Walking and Moving Around Goal Status [id number]): At least 1 percent but less than 20 percent impaired, limited or restricted    Ivar Drape 11/03/2015, 1:23 PM    Samul Dada, PT MS Acute Rehab Dept. Number: [facility] [id number] and [facility] [phone]

## 2015-11-03 NOTE — Progress Notes (Signed)
Patient will DC to: [facility] Anticipated DC date: 11/03/15 Family notified: Daughter Transport by: Sharin Mons   Per MD patient ready for DC to [facility]. RN, patient, patient's family, and facility notified of DC. RN given number for report. DC packet on chart. Ambulance transport requested for patient.   CSW signing off.  Cristobal Goldmann, [place] Clinical Social Worker [phone]

## 2015-11-03 NOTE — Progress Notes (Signed)
Inpatient Diabetes Program Recommendations  AACE/ADA: New Consensus Statement on Inpatient Glycemic Control (2015)  Target Ranges:  Prepandial:   less than 140 mg/dL      Peak postprandial:   less than 180 mg/dL (1-2 hours)      Critically ill patients:  140 - 180 mg/dL  Results for BRYTTANI, BLEW (MRN [id number]) as of 11/03/2015 13:57  Ref. Range 11/02/2015 19:40 11/03/2015 00:11 11/03/2015 04:15 11/03/2015 07:48 11/03/2015 11:50  Glucose-Capillary Latest Ref Range: 65-99 mg/dL 383 (H) 291 (H) 916 (H) 136 (H) 315 (H)   Review of Glycemic Control  Diabetes history: DM 2 Outpatient Diabetes medications: Lantus 35 units q d + Novolog correction 0-10 units tid Current orders for Inpatient glycemic control: Lantus 25 units q d + Novolog correction 0-15 units q 4 hrs.  Inpatient Diabetes Program Recommendations:  Noted patient is eating and will need to change Novolog correction to tid with meals. Added carbohydrate modified to diet.  Thank you, Billy Fischer. Deveron Shamoon, RN, MSN, CDE Inpatient Glycemic Control Team Team Pager [phone] (8am-5pm) 11/03/2015 2:12 PM

## 2015-11-03 NOTE — NC FL2 (Signed)
[place] MEDICAID FL2 LEVEL OF CARE SCREENING TOOL     IDENTIFICATION  Patient Name: Cynthia Fuller Birthdate: [birth year] Sex: female Admission Date (Current Location): 11/01/2015  [facility] and [place] Number:  Producer, television/film/video and Address:  [facility], [address], [place], [place] [address]      Provider Number: [id number]  Attending Physician Name and Address:  Inez Catalina, MD  Relative Name and Phone Number:  Arnoldo Lenis. Phone # [phone]    Current Level of Care: Hospital Recommended Level of Care: Skilled Nursing Facility Prior Approval Number:    Date Approved/Denied:   PASRR Number: [id number]  Discharge Plan: SNF    Current Diagnoses: Patient Active Problem List   Diagnosis Date Noted  . Pancreatitis 11/02/2015  . Acute pancreatitis 11/02/2015  . Elevated lipase 11/02/2015  . RUQ pain   . Constipation due to slow transit 11/01/2015  . Dyslipidemia associated with type 2 diabetes mellitus (HCC) 10/26/2015  . Tobacco abuse   . Abnormality of gait   . Leg weakness   . Lower extremity weakness 10/21/2015  . 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 Hypertension 10/21/2015  . OSA (obstructive sleep apnea) 10/21/2015  . HLD (hyperlipidemia) 10/21/2015  . Rheumatoid arthritis (HCC) 10/21/2015    Orientation RESPIRATION BLADDER Height & Weight     Self, Time, Situation, Place  Normal Continent Weight: 89.54 kg (197 lb 6.4 oz) Height:  5\' 2"  (157.5 cm)  BEHAVIORAL SYMPTOMS/MOOD NEUROLOGICAL BOWEL NUTRITION STATUS      Incontinent Diet (Please see DC summary)  AMBULATORY STATUS COMMUNICATION OF NEEDS Skin   Extensive Assist Verbally Normal                       Personal Care Assistance Level of Assistance  Bathing, Feeding, Dressing Bathing Assistance: Limited assistance Feeding assistance: Independent Dressing Assistance: Limited assistance     Functional Limitations  Info  Sight Sight Info: Impaired        SPECIAL CARE FACTORS FREQUENCY  PT (By licensed PT)     PT Frequency: min 3x/week              Contractures      Additional Factors Info  Code Status, Allergies, Insulin Sliding Scale Code Status Info: Full Allergies Info: Neomycin   Insulin Sliding Scale Info: insulin aspart (novoLOG) injection 0-15 Units;insulin glargine (LANTUS) injection 25 Units       Current Medications (11/03/2015):  This is the current hospital active medication list Current Facility-Administered Medications  Medication Dose Route Frequency Provider Last Rate Last Dose  . acetaminophen (TYLENOL) tablet 500 mg  500 mg Oral Q6H PRN 11/05/2015, MD   500 mg at 11/03/15 0958  . benazepril (LOTENSIN) tablet 20 mg  20 mg Oral Daily 11/05/15, MD   20 mg at 11/03/15 11/05/15   And  . hydrochlorothiazide (MICROZIDE) capsule 12.5 mg  12.5 mg Oral Daily 3299, MD   12.5 mg at 11/03/15 0958  . bisacodyl (DULCOLAX) suppository 10 mg  10 mg Rectal Daily PRN Carly J Rivet, MD      . enoxaparin (LOVENOX) injection 40 mg  40 mg Subcutaneous Q24H 11/05/15, MD   40 mg at 11/03/15 0959  . insulin aspart (novoLOG) injection 0-15 Units  0-15 Units Subcutaneous Q4H 08-11-1984, MD   11 Units at 11/03/15 1232  . insulin glargine (LANTUS) injection 25 Units  25  Units Subcutaneous QHS Valentino Nose, MD   25 Units at 11/02/15 2210  . ipratropium-albuterol (DUONEB) 0.5-2.5 (3) MG/3ML nebulizer solution 3 mL  3 mL Nebulization Q6H PRN Carly J Rivet, MD      . nicotine (NICODERM CQ - dosed in mg/24 hours) patch 21 mg  21 mg Transdermal Daily Su Hoff, MD   21 mg at 11/02/15 1703  . promethazine (PHENERGAN) tablet 12.5 mg  12.5 mg Oral Q6H PRN Carly J Rivet, MD      . sodium phosphate (FLEET) 7-19 GM/118ML enema 1 enema  1 enema Rectal Daily PRN Su Hoff, MD         Discharge Medications: Please see discharge summary for a list of discharge  medications.  Relevant Imaging Results:  Relevant Lab Results:   Additional Information SSN: [id number] [place], [place]

## 2015-11-03 NOTE — Care Management Note (Signed)
Case Management Note  Patient Details  Name: Cynthia Fuller MRN: [id number] Date of Birth: [birth year]  Subjective/Objective:    Constipation                Action/Plan: Discharge Planning:  Pt lives at home alone. She was recently at [facility] but does not want to return.CSW referral for SNF placement. Pt requesting [facility], or [facility]. Has cane at home. Dtr, Cynthia Fuller able to assist at home as needed.   Zollie Pee MD   Expected Discharge Date:  11/04/2015              Expected Discharge Plan:  Skilled Nursing Facility  In-House Referral:  Clinical Social Work  Discharge planning Services  CM Consult  Post Acute Care Choice:  NA Choice offered to:  NA  DME Arranged:  N/A DME Agency:  NA  HH Arranged:  NA HH Agency:  NA  Status of Service:  Completed, signed off  Medicare Important Message Given:    Date Medicare IM Given:    Medicare IM give by:    Date Additional Medicare IM Given:    Additional Medicare Important Message give by:     If discussed at Long Length of Stay Meetings, dates discussed:    Additional Comments:  Elliot Cousin, RN 11/03/2015, 11:36 AM

## 2015-11-03 NOTE — Discharge Summary (Signed)
Name: Cynthia Fuller MRN: [id number] DOB: October 04, 1945 70 y.o. PCP: Ileana Ladd, MD  Date of Admission: 11/01/2015  5:27 PM Date of Discharge: 11/03/2015 Attending Physician: Inez Catalina, MD  Discharge Diagnosis: Principal Problem:   Constipation due to slow transit Active Problems:   Lower extremity weakness   Diabetes mellitus type 2, insulin dependent (HCC)   Hypertension   Rheumatoid arthritis (HCC)   Dyslipidemia associated with type 2 diabetes mellitus (HCC)   Pancreatitis   Elevated lipase   RUQ pain  Discharge Medications:   Medication List    STOP taking these medications        magnesium hydroxide 400 MG/5ML suspension  Commonly known as:  MILK OF MAGNESIA     sodium phosphate enema  Commonly known as:  FLEET      TAKE these medications        ALPRAZolam 0.5 MG tablet  Commonly known as:  XANAX  Take 1 tablet (0.5 mg total) by mouth 3 (three) times daily as needed for anxiety.     aspirin EC 81 MG tablet  Take 81 mg by mouth daily.     atorvastatin 40 MG tablet  Commonly known as:  LIPITOR  Take 40 mg by mouth daily.     benazepril-hydrochlorthiazide 20-12.5 MG tablet  Commonly known as:  LOTENSIN HCT  Take 1 tablet by mouth daily.     bisacodyl 10 MG suppository  Commonly known as:  DULCOLAX  Place 1 suppository (10 mg total) rectally daily as needed for moderate constipation.     COMBIVENT RESPIMAT 20-100 MCG/ACT Aers respimat  Generic drug:  Ipratropium-Albuterol  Inhale 1 puff into the lungs every 6 (six) hours as needed for wheezing.     Fish Oil 1000 MG Caps  Take 1,000 mg by mouth daily.     ibuprofen 200 MG tablet  Commonly known as:  ADVIL,MOTRIN  Take 200 mg by mouth every 6 (six) hours as needed (pain).     insulin aspart 100 UNIT/ML injection  Commonly known as:  novoLOG  Inject 0-10 Units into the skin 3 (three) times daily before meals. 100-150=5 units 151-200=8 units >=201 = 10 units     insulin glargine 100 UNIT/ML  injection  Commonly known as:  LANTUS  Inject 35 Units into the skin at bedtime.     magnesium citrate Soln  Take 296 mLs (1 Bottle total) by mouth once. For constipation.     multivitamin with minerals tablet  Take 1 tablet by mouth daily.     nicotine 21 mg/24hr patch  Commonly known as:  NICODERM CQ - dosed in mg/24 hours  Place 1 patch (21 mg total) onto the skin daily.     polyethylene glycol packet  Commonly known as:  MIRALAX  Take 17 g by mouth daily.     promethazine 25 MG tablet  Commonly known as:  PHENERGAN  Take 25 mg by mouth every 6 (six) hours as needed for nausea or vomiting.        Disposition and follow-up:   [name] was discharged from [facility] in Stable condition.  At the hospital follow up visit please address:  1. Constipation: abdominal pain 2/2 constipation with no BM in a week. Resolved following magnesium citrate, was able to have 8 large bowel movements. Will continue bowel regimen outpatient.   2.  Labs / imaging needed at time of follow-up: None  3.  Pending labs/ test needing  follow-up: None  Follow-up Appointments:   Discharge Instructions:   Consultations:    Procedures Performed:  Dg Lumbar Spine Complete  10/28/2015  CLINICAL DATA:  Back pain for 3 days. No injury. Pain radiates to both hips and legs. Decreased use of legs. EXAM: LUMBAR SPINE - COMPLETE 4+ VIEW COMPARISON:  MRI spine 10/21/2015.  Lumbar spine 10/21/2015. FINDINGS: Normal alignment of the lumbar spine. No vertebral compression deformities. No focal bone lesion or bone destruction. Degenerative changes in the lower lumbar facet joints. Transitional lumbosacral segment is numbered as L5 for the purposes of this study, using the same convention as with the prior MRI. Prominent vascular calcification in the aorta and iliac arteries. IMPRESSION: No acute bony abnormalities. Degenerative changes in the lower lumbar spine. Electronically Signed   By:  Burman Nieves M.D.   On: 10/28/2015 05:49   Dg Lumbar Spine Complete  10/21/2015  CLINICAL DATA:  Larey Seat this morning.  Low back pain. EXAM: LUMBAR SPINE - COMPLETE 4+ VIEW COMPARISON:  None. FINDINGS: There is no evidence of lumbar spine fracture. Alignment is normal. Intervertebral disc spaces are maintained. IMPRESSION: Negative. Electronically Signed   By: Ellery Plunk M.D.   On: 10/21/2015 04:22   Ct Head Wo Contrast  10/21/2015  CLINICAL DATA:  70 year old female with fall and bilateral leg weakness. EXAM: CT HEAD WITHOUT CONTRAST TECHNIQUE: Contiguous axial images were obtained from the base of the skull through the vertex without intravenous contrast. COMPARISON:  None. FINDINGS: The ventricles and sulci are appropriate in size for patient's age. Minimal periventricular and deep white matter chronic microvascular ischemic changes noted. There is no acute intracranial hemorrhage. No mass effect or midline shift. The visualized paranasal sinuses and mastoid air cells are clear. The calvarium is intact. IMPRESSION: No acute intracranial pathology. Electronically Signed   By: Elgie Collard M.D.   On: 10/21/2015 04:05   Mr Maxine Glenn Head Wo Contrast  10/22/2015  CLINICAL DATA:  70 year old female with acute onset bilateral lower extremity weakness, lightheadedness. Negative brain diffusion imaging yesterday. Initial encounter. EXAM: MRA HEAD WITHOUT CONTRAST TECHNIQUE: Angiographic images of the Circle of Willis were obtained using MRA technique without intravenous contrast. COMPARISON:  Brain diffusion-weighted imaging 10/21/2015. Noncontrast head CT 10/21/2015. FINDINGS: No intracranial mass effect or ventriculomegaly. Antegrade flow in the posterior circulation with fairly codominant distal vertebral arteries. No distal vertebral stenosis. Mild MOTSA artifact along the proximal basilar artery which otherwise appears normal. Normal SCA and PCA origins. Both posterior communicating arteries are  present in her mildly tortuous. Normal PCA branches. Antegrade flow in the distal cervical ICAs and both ICA siphons. Evidence of calcified siphon atherosclerosis, but no siphon stenosis. Ophthalmic and posterior communicating artery origins are normal. Carotid termini are normal. MCA and ACA origins are normal. Mildly tortuous A1 segments. Anterior communicating artery and visualized bilateral ACA branches are within normal limits. Mildly tortuous MCA M1 segments. Bilateral MCA bifurcations and visualized bilateral MCA branches are within normal limits. IMPRESSION: Negative intracranial MRA. Electronically Signed   By: Odessa Fleming M.D.   On: 10/22/2015 09:52   Mr Brain Wo Contrast  10/21/2015  CLINICAL DATA:  Acute onset bilateral lower extremity weakness and lightheadedness after walking into kitchen. Associated nausea and urinary incontinence. History of hypertension, rheumatoid arthritis and diabetes. EXAM: MRI HEAD WITHOUT CONTRAST TECHNIQUE: Axial diffusion weighted imaging obtained on a 1.5 tesla scanner. Patient was unable to tolerate further imaging due to claustrophobia and anxiety and examination was prematurely terminated. COMPARISON:  CT HEAD Oct 21, 2015 at 0355 hours FINDINGS: No reduced diffusion to suggest acute ischemia. No signal abnormality to suggest hemorrhage. Ventricles and sulci are normal for a patient's age. No midline shift, mass effect. IMPRESSION: Limited single sequence MRI of the brain: No acute ischemia. Patient is scheduled to return tomorrow to complete exam. Electronically Signed   By: Awilda Metro M.D.   On: 10/21/2015 22:03   Mr Lumbar Spine W Wo Contrast  10/21/2015  CLINICAL DATA:  70 year old female status post fall. Nausea. Hyperglycemia. Back pain. Weakness. Initial encounter. EXAM: MRI LUMBAR SPINE WITHOUT AND WITH CONTRAST TECHNIQUE: Multiplanar and multiecho pulse sequences of the lumbar spine were obtained without and with intravenous contrast. CONTRAST:  44mL  MULTIHANCE GADOBENATE DIMEGLUMINE 529 MG/ML IV SOLN COMPARISON:  Lumbar spine radiographs 0404 hours today. Chest CTA 08/16/2006. FINDINGS: Correlation of the chest CTA with today lumbar radiographs confirms the lowest full size ribs are T11, with hypoplastic or absent ribs at T12. Subsequently the L5 level is partially or mostly sacralized. Correlation with radiographs is recommended prior to any operative intervention. Trace anterolisthesis at L3-L4 and L4-L5. Otherwise normal vertebral height and alignment. Overall normal bone marrow signal. No marrow edema or evidence of acute osseous abnormality. Motion artifact on post-contrast imaging. Visualized lower thoracic spinal cord is normal with conus medularis at L1. No abnormal intradural enhancement. Negative visualized abdominal viscera. Partially visible mild bladder distension. Posterior paraspinal soft tissues remarkable for mild subcutaneous edema and patchy bilateral abnormal T2 and STIR hyperintensity in the erector spinae muscles (thoracolumbar junction level on the left series 6, image 12 and lower lumbar spine on the right series 6, image 1). Associated mild patchy enhancement of the muscles. No associated fluid collection. T9-T10:  Negative. T10-T11: Negative. T11-T12:  Negative. T12-L1:  Negative. L1-L2:  Mild facet and ligament flavum hypertrophy. L2-L3: Minimal disc bulge. Mild facet and ligament flavum hypertrophy. L3-L4: Trace anterolisthesis. Mild circumferential disc bulge. Moderate facet hypertrophy. Mild L3 foraminal stenosis. L4-L5: Trace anterolisthesis. Circumferential disc bulge. Small central posterior annular fissure. Mild to moderate facet hypertrophy greater on the right. Mild right L4 foraminal stenosis. L5-S1:  Largely sacralized and negative. IMPRESSION: 1. No acute osseous abnormality in the lumbar spine. Transitional anatomy. 2. Occasional abnormal signal in the bilateral posterior paraspinal muscles. Legrand Rams this is posttraumatic  muscle contusion or myositis given the scattered and patchy morphology. No associated fluid collection. 3. Mild degenerative spondylolisthesis at L3-L4 and L4-L5 with associated mild disc and moderate facet degeneration. No lumbar spinal or lateral recess stenosis. There is mild L3 and L4 foraminal stenosis. Electronically Signed   By: Odessa Fleming M.D.   On: 10/21/2015 07:49   Ct Abdomen Pelvis W Contrast  11/02/2015  CLINICAL DATA:  Acute onset of lower extremity weakness. Lower back pain and constipation. Initial encounter. EXAM: CT ABDOMEN AND PELVIS WITH CONTRAST TECHNIQUE: Multidetector CT imaging of the abdomen and pelvis was performed using the standard protocol following bolus administration of intravenous contrast. CONTRAST:  ISOVUE-300 IOPAMIDOL (ISOVUE-300) INJECTION 61% COMPARISON:  Abdominal radiograph performed 10/29/2015 FINDINGS: The visualized lung bases are clear. Scattered coronary artery calcifications are seen. The liver and spleen are unremarkable in appearance. The gallbladder is within normal limits. The pancreas and adrenal glands are unremarkable. The kidneys are unremarkable in appearance. There is no evidence of hydronephrosis. No renal or ureteral stones are seen. No perinephric stranding is appreciated. No free fluid is identified. The small bowel is unremarkable in appearance. The stomach is within normal limits. No acute  vascular abnormalities are seen. Relatively diffuse calcification is seen along the abdominal aorta and its branches, with associated mural thrombus but no significant luminal narrowing. The appendix is normal in caliber, without evidence of appendicitis. The colon is largely filled with stool, though the sigmoid colon is decompressed. Minimal diverticulosis is noted along the descending and sigmoid colon, without evidence of diverticulitis. The bladder is mildly distended and grossly unremarkable. The patient is status post hysterectomy. No suspicious adnexal  masses are seen. The left ovary is unremarkable in appearance. No inguinal lymphadenopathy is seen. No acute osseous abnormalities are identified. Facet disease is noted along the lumbar spine. IMPRESSION: 1. Large amount of stool noted within the colon, compatible with constipation. The sigmoid colon is largely decompressed. 2. Relatively diffuse calcification along the abdominal aorta and its branches, with associated mural thrombus but no significant luminal narrowing. 3. Scattered coronary artery calcifications seen. 4. Minimal diverticulosis along the descending and sigmoid colon, without evidence of diverticulitis. Electronically Signed   By: Roanna Raider M.D.   On: 11/02/2015 01:28   US Abdomen Limited  11/02/2015  CLINICAL DATA:  Several days of right upper quadrant pain. EXAM: US ABDOMEN LIMITED - RIGHT UPPER QUADRANT COMPARISON:  CT of the abdomen and pelvis 11/01/2015 FINDINGS: Gallbladder: No gallstones or wall thickening visualized. No sonographic Murphy sign noted by sonographer. Common bile duct: Diameter: 4.9 mm. Liver: No focal lesion identified. Within normal limits in parenchymal echogenicity. IMPRESSION: Normal right upper quadrant ultrasound, given the technical limitations of this exam due to patient's body habitus. Electronically Signed   By: Ted Mcalpine M.D.   On: 11/02/2015 15:58   Dg Abd Acute W/chest  10/29/2015  CLINICAL DATA:  Mid to lower abdominal pain and nausea for 5 days, history diabetes mellitus, hypertension EXAM: DG ABDOMEN ACUTE W/ 1V CHEST COMPARISON:  10/21/2015 FINDINGS: Upper normal heart size. Normal mediastinal contours and pulmonary vascularity for technique. Atherosclerotic calcification aorta. Lungs clear. No pleural effusion or pneumothorax. Nonobstructive bowel gas pattern. No bowel dilatation, bowel wall thickening, or free intraperitoneal air. Diffuse osseous demineralization. No urinary tract calcification. IMPRESSION: No acute abnormalities.  Electronically Signed   By: Ulyses Southward M.D.   On: 10/29/2015 12:33   Dg Abd Acute W/chest  10/21/2015  CLINICAL DATA:  70 year old female with nausea vomiting abdominal pain EXAM: DG ABDOMEN ACUTE W/ 1V CHEST COMPARISON:  Chest radiograph dated 06/06/2010 FINDINGS: Single-view of the chest does not demonstrate a focal consolidation. There is mild increased vascular prominence concerning for mild congestion. There is no pleural effusion or pneumothorax. Stable cardiac silhouette. Moderate stool noted throughout the colon and within the rectum with possible fecal impaction. There is no evidence of bowel obstruction. No free air. No radiopaque calculi or foreign object. There is degenerative changes of the spine. No acute fracture. IMPRESSION: Mild congestion.  No focal consolidation. Constipation.  No bowel obstruction. Electronically Signed   By: Elgie Collard M.D.   On: 10/21/2015 02:50   Admission HPI: Ms. Cynthia Fuller is a 70 year old lady with PMH of T2DM, HTN, HLD, OSA, and RA with recent hospital admission for lower extremity weakness who presents with abdominal pain and constipation. Patient states that she has not had a bowel movement in 7 days. She says her pain began last Thursday (10/28/15), involving her back and abdomen. She was seen at [facility] ED on the 18th and 19th for this and was given toradol, oxycodone, and lidocaine patch without relief. Imaging from prior admission and  ED visits including DG abdomen and chest, DG lumbar spine, MR MRA head, MR brain, MR lumbar spine, DG lumbar spine, CT head were without acute pathology. SNF placement was arranged from the ED.   Patient returned to [facility] ED yesterday with current complaints of constipation and abdominal pain. She says she has tried miralax, enema, stool softeners, and suppositories without relief. She is passing flatus. She feels bloated and notes her abdomen seems distended. She has not taken narcotics in the interim, instead  using Ibuprofen and tylenol for pain. She reports associated nausea, decreased appetite and fluid intake. She reports a non-productive cough and continued bilateral leg weakness. She denies any chest pain, SOB, fever, chills, diaphoresis, diarrhea, vomiting, dysuria.  In the ED, disimpaction was attempted, but no stool was present in the rectum. Lab work revealed a lipase of 189. A CT abdomen/pelvis was obtained which showed a large amount of stool in the colon, diffuse calcification of the abdominal aorta and branches with associated mural thrombus without significant luminal narrowing. Minimal diverticulosis w/o evidence for diverticulitis is also seen. Pancrease unremarkable on CT read. She was given morphine for pain and mag citrate for constipation. Transferred to [facility] for admission under IMTS.  Social Hx: No alcohol in 9 years, quit smoking "cold [place]" last week, no illicit drug use  Familiy Hx: Father died of pancreatic cancer, Mother died of kidney failure, CHF in brother, DM and kidney disease in brother and sister  Hospital Course by problem list:  Constipation: Large amount of stool seen on CT abdomen without SBO. Disimpaction was unsuccessful in the ED as no stool in the rectum. Patient reported trying "everything" at home including suppositories, laxatives, enemas. Possibly related to recent narcotic administration. She was able to have a bowel movement after receiving magnesium citrate. Has 8 large bowel movements with resolution of her abdominal pain. TSH was wnl. Restarted home bowel regimen.   Elevated Lipase: Lipase 189 with non-specific abdominal pain and constipation. She denies alcohol use (reports she quit 9 years ago). CT abdomen reads an unremarkable pancreas and normal gallbladder. LFTS without an obstructive pattern. Denies any history of gallstones. Symptoms have improved following having a BM. She reports nausea but denies any recent emesis that would account for the  elevated lipase. Have a low suspicion this is a true pancreatitis and her abdominal pain and symptoms are most likely secondary to her constipation with improvement following bowel movement today. No clear risk factors for pancreatitis and has a CT abdomen showing a normal pancreas and gallbladder. RUQ US unremarkable with no stones. Possible contributing factors include prednisone use last week for her RA, benazepril-HCTZ. Recently started on atorvastatin. TG were wnl. Advanced diet to full with no nausea or vomiting. Re-checked lipase and was 22. Believe her initial elevated lipase was spurious and her abdominal symptoms were related to her constipation with pain resolution following bowel movement.   Aortic Mural Thrombus: Seen on CT abdomen along with diffuse calcification of the abdominal aorta and branches. No significant luminal narrowing or AAA seen. No signs of limb ischemia on exam. Vascular surgery consulted. Said there is evidence of aortoiliac occlusive disease with palpable pulse in her feet with no further vascular work up indicated at this time. Smoking cessation will be important.   T2DM: Hgb A1C 8.1 on 10/22/2015. On Lantus 35u qhs and Novolog SSI at home. Continue home medications.   HLD: Recently started on Atorvastatin. Possibly related to lower extremity weakness, however not thought to  contribute during last admission. Initially held atorvastatin when considering acute pancreatitis but restarted once lipase was 22 on repeat.   Tobacco use: Quit cold [place] last week. Continue Nicotine patches.  Discharge Vitals:   BP 150/80 mmHg  Pulse 84  Temp(Src) 98.5 F (36.9 C) (Oral)  Resp 18  Ht 5\' 2"  (1.575 m)  Wt 197 lb 6.4 oz (89.54 kg)  BMI 36.10 kg/m2  SpO2 98%  Discharge Labs:  Results for orders placed or performed during the hospital encounter of 11/01/15 (from the past 24 hour(s))  Glucose, capillary     Status: Abnormal   Collection Time: 11/02/15  7:40 PM  Result  Value Ref Range   Glucose-Capillary 169 (H) 65 - 99 mg/dL  Glucose, capillary     Status: Abnormal   Collection Time: 11/03/15 12:11 AM  Result Value Ref Range   Glucose-Capillary 153 (H) 65 - 99 mg/dL  Glucose, capillary     Status: Abnormal   Collection Time: 11/03/15  4:15 AM  Result Value Ref Range   Glucose-Capillary 116 (H) 65 - 99 mg/dL  Lipase, blood     Status: None   Collection Time: 11/03/15  5:32 AM  Result Value Ref Range   Lipase 22 11 - 51 U/L  Basic metabolic panel     Status: Abnormal   Collection Time: 11/03/15  5:32 AM  Result Value Ref Range   Sodium 139 135 - 145 mmol/L   Potassium 3.8 3.5 - 5.1 mmol/L   Chloride 102 101 - 111 mmol/L   CO2 30 22 - 32 mmol/L   Glucose, Bld 121 (H) 65 - 99 mg/dL   BUN 5 (L) 6 - 20 mg/dL   Creatinine, Ser 11/05/15 0.44 - 1.00 mg/dL   Calcium 9.1 8.9 - 7.94 mg/dL   GFR calc non Af Amer >60 >60 mL/min   GFR calc Af Amer >60 >60 mL/min   Anion gap 7 5 - 15  Glucose, capillary     Status: Abnormal   Collection Time: 11/03/15  7:48 AM  Result Value Ref Range   Glucose-Capillary 136 (H) 65 - 99 mg/dL   Comment 1 Notify RN    Comment 2 Document in Chart   Glucose, capillary     Status: Abnormal   Collection Time: 11/03/15 11:50 AM  Result Value Ref Range   Glucose-Capillary 315 (H) 65 - 99 mg/dL   Comment 1 Notify RN    Comment 2 Document in Chart   Glucose, capillary     Status: Abnormal   Collection Time: 11/03/15  4:27 PM  Result Value Ref Range   Glucose-Capillary 197 (H) 65 - 99 mg/dL    Signed: 11/05/15, MD 11/03/2015, 5:29 PM

## 2015-11-03 NOTE — Clinical Social Work Note (Signed)
Clinical Social Work Assessment  Patient Details  Name: Cynthia Fuller MRN: [id number] Date of Birth: Dec 13, 1945  Date of referral:  11/03/15               Reason for consult:  Facility Placement                Permission sought to share information with:  Facility Sport and exercise psychologist, Family Supports Permission granted to share information::  Yes, Verbal Permission Granted  Name::     [place]::  [facility] SNFs  Relationship::  Daughter  Contact Information:  [phone]  Housing/Transportation Living arrangements for the past 2 months:  [place], [place] of Information:  Patient, Adult Children Patient Interpreter Needed:  None Criminal Activity/Legal Involvement Pertinent to Current Situation/Hospitalization:  No - Comment as needed Significant Relationships:  Adult Children Lives with:  Self Do you feel safe going back to the place where you live?  No Need for family participation in patient care:  Yes (Comment)  Care giving concerns:  CSW received referral for possible SNF placement at time of discharge. CSW met with patient and patient's daughters at bedside regarding PT recommendation of SNF placement at time of discharge. Per patient's daughters, patient is currently unable to care for herself at home given patient's current physical needs and fall risk. Patient and patient's daughters expressed understanding of PT recommendation and are agreeable to SNF placement at time of discharge. Patient was at both [facility] and [facility] and reported being unhappy with the care there. CSW to continue to follow and assist with discharge planning needs.   Social Worker assessment / plan:  CSW spoke with patient and patient's daughters concerning possibility of rehab at [facility] before returning home.  Employment status:  Retired Nurse, adult PT Recommendations:  [place] / Referral to  community resources:  [place]  Patient/Family's Response to care:  Patient and patient's daughters recognize need for rehab before returning home and are agreeable to a SNF in [place]. Patient reported preference for [facility] or [facility].  Patient/Family's Understanding of and Emotional Response to Diagnosis, Current Treatment, and Prognosis:  Patient is realistic regarding therapy needs. No questions/concerns about plan or treatment.    Emotional Assessment Appearance:  Appears stated age Attitude/Demeanor/Rapport:  Other (Appropriate) Affect (typically observed):  Accepting, Adaptable, Appropriate Orientation:  Oriented to Self, Oriented to Place, Oriented to  Time, Oriented to Situation Alcohol / Substance use:    Psych involvement (Current and /or in the community):  No (Comment)  Discharge Needs  Concerns to be addressed:  Care Coordination Readmission within the last 30 days:  Yes Current discharge risk:  None Barriers to Discharge:  No Barriers Identified   Benard Halsted, LCSWA 11/03/2015, 5:59 PM

## 2015-11-11 ENCOUNTER — Ambulatory Visit: Payer: Medicare Other | Admitting: Skilled Nursing Facility1

## 2015-11-22 DIAGNOSIS — F419 Anxiety disorder, unspecified: Secondary | ICD-10-CM | POA: Insufficient documentation

## 2015-11-22 DIAGNOSIS — R251 Tremor, unspecified: Secondary | ICD-10-CM | POA: Insufficient documentation

## 2015-11-22 DIAGNOSIS — M159 Polyosteoarthritis, unspecified: Secondary | ICD-10-CM | POA: Insufficient documentation

## 2015-12-01 ENCOUNTER — Other Ambulatory Visit: Payer: Self-pay | Admitting: Family Medicine

## 2015-12-01 DIAGNOSIS — R29898 Other symptoms and signs involving the musculoskeletal system: Secondary | ICD-10-CM

## 2015-12-13 ENCOUNTER — Ambulatory Visit
Admission: RE | Admit: 2015-12-13 | Discharge: 2015-12-13 | Disposition: A | Discharge status: Home / Self Care | Payer: Medicare Other | Source: Ambulatory Visit | Attending: Family Medicine | Admitting: Family Medicine

## 2015-12-13 DIAGNOSIS — R29898 Other symptoms and signs involving the musculoskeletal system: Secondary | ICD-10-CM

## 2016-02-15 ENCOUNTER — Ambulatory Visit (INDEPENDENT_AMBULATORY_CARE_PROVIDER_SITE_OTHER): Payer: Medicare Other | Admitting: Neurology

## 2016-02-15 ENCOUNTER — Encounter: Payer: Self-pay | Admitting: Neurology

## 2016-02-15 VITALS — BP 96/70 | HR 99 | Ht 62.0 in | Wt 185.2 lb

## 2016-02-15 DIAGNOSIS — E538 Deficiency of other specified B group vitamins: Secondary | ICD-10-CM

## 2016-02-15 DIAGNOSIS — R29898 Other symptoms and signs involving the musculoskeletal system: Secondary | ICD-10-CM | POA: Diagnosis not present

## 2016-02-15 DIAGNOSIS — M609 Myositis, unspecified: Secondary | ICD-10-CM

## 2016-02-15 DIAGNOSIS — M6289 Other specified disorders of muscle: Secondary | ICD-10-CM | POA: Diagnosis not present

## 2016-02-15 DIAGNOSIS — M6281 Muscle weakness (generalized): Secondary | ICD-10-CM

## 2016-02-15 DIAGNOSIS — G629 Polyneuropathy, unspecified: Secondary | ICD-10-CM

## 2016-02-15 NOTE — Progress Notes (Signed)
[facility]    Provider:  Dr Lucia Gaskins Referring Provider: Ileana Ladd, MD Primary Care Physician:  Redmond Baseman, MD  CC:  Leg weakness  HPI:  Cynthia Fuller is a 70 y.o. female here as a referral from Dr. Modesto Charon for leg weakness. Past medical history of diabetes, hypertension, rheumatoid myopathy with rheumatoid arthritis, sleep apnea, asthma, depression, lower extremity weakness, dyslipidemia, pancreatitis. She is a current smoker half pack per day smoking over the last 40 years. She has been to [facility] and she says nothing is wrong, no one knows what is going on. Weakness started in April with weakness al over. In May she collapsed and couldn't walk. She stopped the Lipitor in May because she thought that may be answer. She is improving. She couldn;t put her legs on the bed, couldn't turn over by herself, she couldn't walk. Before May she was walking her dog 3x a day. On April 19th the weakness occurred acutely over one day after starting metformin she called EMS due to the weakness. Her legs went numb she tought she was paralyzed. It has improved since then. She has pins and needles around the ankles to her knees mostly on the front. Her arms feel good mostly in the legs. She has continued difficulty walking and climbing steps. No problems lifting arms overhead. No significant back pain. The weakness is continuous, not dependent of time of day, worse with exertion, Harder with inclines. No pain. Bo double vision, no neck pain, she has low back tightness in the muscles without radicular pain. No FHx neuromuscular disorder.  Reviewed notes, labs and imaging from outside physicians, which showed:  Hgba1c 8.1, TSH 1.705, CK 260  Patient was evaluated by neurosurgery.(will request notes).   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 There is a vestigial disc. T12 is  transitional with only a tiny right rib. There are 11 typical ribs. Alignment:  3 mm spondylolisthesis at L3-4 and L4-5. Vertebrae: Vertebral bodies are normal. Severe bilateral facet arthritis at L3-4 and severe right facet arthritis at L4-5. Conus medullaris: Extends to the L1-2 level and appears normal.  Paraspinal and other soft tissues: Extensive aortic atherosclerosis. Slight prominence of the mid abdominal aorta to a diameter of 2.8 Cm.   T11-12 through L2-3:  Normal.  L3-4: Severe bilateral facet arthritis with 3 mm spondylolisthesis.Disc desiccation. Tiny broad-based disc bulge of the uncovered disc with no neural impingement. No spinal or foraminal stenosis. No change.  L4-5: Disc desiccation. Small broad-based disc bulge with a new subtle disc protrusion central and to the left deviating the left L5 nerve root sleeve posteriorly seen on images 2 of series 11 and 17. Severe right facet arthritis, slightly progressed. No foraminal stenosis.  L5-S1:  Congenital fusion with the sacrum.  Vestigial disc.  IMPRESSION: 1. New small soft disc protrusion central and to the left at L4-5 with a slight mass effect upon the left L5 nerve. 2. Slight progression of severe right facet arthritis at L4-5. 3. Chronic severe bilateral facet arthritis at L3-4 with chronic grade 1 spondylolisthesis.  MRI brain 10/2015: FINDINGS: No reduced diffusion to suggest acute ischemia. No signal abnormality to suggest hemorrhage. Ventricles and sulci are normal for a patient's age. No midline shift, mass effect.   IMPRESSION: Limited single sequence MRI of the brain: No acute ischemia.  Review of Systems: Patient complains of symptoms per HPI as well  as the following symptoms: Fatigue, swelling of legs, shortness of breath, hearing loss, allergies, skin sensitivity, tremor, depression, anxiety, decreased energy, snoring. Pertinent negatives per HPI. All others negative.   Social History   Social  History  . Marital status: Divorced    Spouse name: N/A  . Number of children: 5  . Years of education: 12   Occupational History  . unemployed    Social History Main Topics  . Smoking status: Smoker, Current Status Unknown    Packs/day: 0.30    Types: Cigarettes  . Smokeless tobacco: Former Neurosurgeon    Quit date: 10/20/2015     Comment: 1 pack every 3 days  . Alcohol use No  . Drug use: No  . Sexual activity: Not on file   Other Topics Concern  . Not on file   Social History Narrative   Lives alone   Caffeine use: Drinks coffee daily    tea/soda ocass       Family History  Problem Relation Age of Onset  . Diabetes Brother   . Kidney disease Brother     Past Medical History:  Diagnosis Date  . Diabetes mellitus without complication (HCC)   . Hypercholesteremia   . Hypertension   . Rheumatoid arthritis (HCC)   . Sleep apnea     Past Surgical History:  Procedure Laterality Date  . ABLATION  2000/2002?    Current Outpatient Prescriptions  Medication Sig Dispense Refill  . ALPRAZolam (XANAX) 0.5 MG tablet Take 1 tablet (0.5 mg total) by mouth 3 (three) times daily as needed for anxiety. (Patient taking differently: Take 0.5 mg by mouth as needed for anxiety. 1 tablet QHS PRN) 24 tablet 0  . aspirin EC 81 MG tablet Take 81 mg by mouth daily.    . benazepril-hydrochlorthiazide (LOTENSIN HCT) 20-12.5 MG tablet Take 1 tablet by mouth daily.    [name] HYDROcodone-acetaminophen (NORCO/VICODIN) 5-325 MG tablet Take 1 tablet by mouth every 6 (six) hours as needed for moderate pain.    [name] ibuprofen (ADVIL,MOTRIN) 200 MG tablet Take 200 mg by mouth every 6 (six) hours as needed (pain).     . insulin aspart (NOVOLOG) 100 UNIT/ML injection Inject 0-10 Units into the skin 3 (three) times daily before meals. 100-150=5 units 151-200=8 units >=201 = 10 units (Patient taking differently: Inject as per sliding scale: if 0-149=0; 150-600=5, subcutaneously before meals) 3 vial PRN  . insulin  glargine (LANTUS) 100 UNIT/ML injection Inject 35 Units into the skin at bedtime.    . Ipratropium-Albuterol (COMBIVENT RESPIMAT) 20-100 MCG/ACT AERS respimat Inhale 1 puff into the lungs every 6 (six) hours as needed for wheezing.    . polyethylene glycol (MIRALAX) packet Take 17 g by mouth daily. 14 each 0  . promethazine (PHENERGAN) 25 MG tablet Take 25 mg by mouth every 6 (six) hours as needed for nausea or vomiting.    . Multiple Vitamins-Minerals (MULTIVITAMIN WITH MINERALS) tablet Take 1 tablet by mouth daily.     No current facility-administered medications for this visit.     Allergies as of 02/15/2016 - Review Complete 02/15/2016  Allergen Reaction Noted  . Neomycin Hives 10/21/2015  . Other  02/15/2016    Vitals: BP 96/70 (BP Location: Left Arm, Patient Position: Sitting, Cuff Size: Normal)   Pulse 99   Ht 5\' 2"  (1.575 m)   Wt 185 lb 3.2 oz (84 kg)   BMI 33.87 kg/m  Last Weight:  Wt Readings from Last 1 Encounters:  02/15/16  185 lb 3.2 oz (84 kg)   Last Height:   Ht Readings from Last 1 Encounters:  02/15/16 5\' 2"  (1.575 m)   Physical exam: Exam: Gen: NAD, conversant, well nourised, obese, well groomed                     CV: RRR, no MRG. No Carotid Bruits. No peripheral edema, warm, nontender Eyes: Conjunctivae clear without exudates or hemorrhage  Neuro: Detailed Neurologic Exam  Speech:    Speech is normal; fluent and spontaneous with normal comprehension.  Cognition:    The patient is oriented to person, place, and time;     recent and remote memory intact;     language fluent;     normal attention, concentration,     fund of knowledge Cranial Nerves:    The pupils are equal, round, and reactive to light. Attempted funduscopic exam could not visualize due to small pupils. Extraocular movements are intact. Trigeminal sensation is intact and the muscles of mastication are normal. The face is symmetric. The palate elevates in the midline. Hearing intact.  Voice is normal. Shoulder shrug is normal. The tongue has normal motion without fasciculations.   Coordination:    No dysmetria  Gait:   Wide based and slightly stooped  Motor Observation: Head tremor.     No asymmetry, no atrophy Tone:    Normal muscle tone.    Posture:    Posture is slightly stooped    Strength: Left IP 3/5, right IP 4/5, bilat biceps femoris 4+/5 otherwsie    Strength is V/V in the upper and lower limbs.      Sensation: intact to LT and pinprock distally in the feet     Reflex Exam:  DTR's: Absent AJs otherwise very brisk but symmetric for age and medical conditions..   Toes:    The toes are downgoing bilaterally.   Clonus:    Clonus is absent.       Assessment/Plan:  70 year old with weakness in the legs. Past medical history of diabetes, hypertension, rheumatoid myopathy with rheumatoid arthritis, sleep apnea, asthma, depression, lower extremity weakness, dyslipidemia, pancreatitis. She is a current smoker half pack per day smoking over the last 40 years. Exam with proximal LE weakness intact upper extremities, absent AJs otherwise symmetric and brisk reflexes, wide-based gait. MRI lumbar spine and brain without etiology.  4-limb emg Labs today Continue PT, she is a fall risk always use walking aids.  Follow up for emg/ncs andwill continue to follow in the office. Ned to rule out neuromuscular junction disorders, myopathy/myositis and other etiologies. Consider MRI cervical spine if workup above unbremarkable.  Cc: Dr. 62, MD  [facility] [address] [place], [place]  Phone [phone] Fax [phone]

## 2016-02-15 NOTE — Patient Instructions (Signed)
Remember to drink plenty of fluid, eat healthy meals and do not skip any meals. Try to eat protein with a every meal and eat a healthy snack such as fruit or nuts in between meals. Try to keep a regular sleep-wake schedule and try to exercise daily, particularly in the form of walking, 20-30 minutes a day, if you can.   As far as diagnostic testing: emg/ncs and labs  I would like to see you back for emg/ncs, sooner if we need to. Please call us with any interim questions, concerns, problems, updates or refill requests.   Our phone number is [phone]. We also have an after hours call service for urgent matters and there is a physician on-call for urgent questions. For any emergencies you know to call 911 or go to the nearest emergency room

## 2016-02-21 ENCOUNTER — Telehealth: Payer: Self-pay | Admitting: Neurology

## 2016-02-21 NOTE — Telephone Encounter (Signed)
See result note I just sent. So far they are unremarkable. Still pending 2 of them and if they are abnormal we will call her. I think we have an emg/ncs scheduled and can discuss at that time. thanks

## 2016-02-21 NOTE — Telephone Encounter (Signed)
Patient is calling to get lab results

## 2016-02-21 NOTE — Telephone Encounter (Signed)
Dr Lucia Gaskins- pt calling about lab results.  Please advise. She had them done on 02/15/16

## 2016-02-22 ENCOUNTER — Telehealth: Payer: Self-pay | Admitting: *Deleted

## 2016-02-22 NOTE — Telephone Encounter (Signed)
Called and spoke to patient about lab results. Pt verbalized understanding.

## 2016-02-22 NOTE — Telephone Encounter (Signed)
-----   Message from Anson Fret, MD sent at 02/21/2016  5:12 PM EDT ----- So far labs are unremarkable. Still pending 2 of them and if they are abnormal we will call her. I think we have an emg/ncs scheduled. thanks

## 2016-02-22 NOTE — Telephone Encounter (Signed)
Spoke to patient about results. Pt verbalized understanding.

## 2016-02-23 ENCOUNTER — Telehealth: Payer: Self-pay | Admitting: Neurology

## 2016-02-23 NOTE — Telephone Encounter (Signed)
FYI-Spoke to pt who stated that she is NOT going to do the NCV/EMG because she is petrified to have needles put in her legs.  She stated her legs are super sensitive and she just doesn't want to go through the test.-slb

## 2016-03-01 LAB — MULTIPLE MYELOMA PANEL, SERUM
ALBUMIN/GLOB SERPL: 0.7 (ref 0.7–1.7)
ALPHA 1: 0.3 g/dL (ref 0.0–0.4)
ALPHA2 GLOB SERPL ELPH-MCNC: 1.3 g/dL — AB (ref 0.4–1.0)
Albumin SerPl Elph-Mcnc: 3.1 g/dL (ref 2.9–4.4)
B-GLOBULIN SERPL ELPH-MCNC: 1.4 g/dL — AB (ref 0.7–1.3)
Gamma Glob SerPl Elph-Mcnc: 1.5 g/dL (ref 0.4–1.8)
Globulin, Total: 4.5 g/dL — ABNORMAL HIGH (ref 2.2–3.9)
IGG (IMMUNOGLOBIN G), SERUM: 1615 mg/dL — AB (ref 700–1600)
IgA/Immunoglobulin A, Serum: 420 mg/dL (ref 64–422)
IgM (Immunoglobulin M), Srm: 86 mg/dL (ref 26–217)
TOTAL PROTEIN: 7.6 g/dL (ref 6.0–8.5)

## 2016-03-01 LAB — CK: CK TOTAL: 182 U/L — AB (ref 24–173)

## 2016-03-01 LAB — MYOSITIS PANEL III
EJ*: NEGATIVE
JO-1 (WB): NEGATIVE
KU: NEGATIVE
Mi-2 antibodies*: NEGATIVE
OJ: NEGATIVE
PL-12*: NEGATIVE
PL-7: NEGATIVE
PM-SCL 100: NEGATIVE
PM-Scl 75*: NEGATIVE
RNP: 5.6 EU/ml
RO-52*: POSITIVE — AB
SIGNAL RECOGNITION PARTICLE: NEGATIVE

## 2016-03-01 LAB — RPR: RPR Ser Ql: NONREACTIVE

## 2016-03-01 LAB — ACETYLCHOLINE RECEPTOR, BINDING

## 2016-03-01 LAB — B12 AND FOLATE PANEL
Folate: 15.2 ng/mL (ref >3.0)
Vitamin B-12: 409 pg/mL (ref 211–946)

## 2016-03-01 LAB — HEAVY METALS, BLOOD
Arsenic: 13 ug/L (ref 2–23)
Lead, Blood: NOT DETECTED ug/dL (ref 0–19)
MERCURY: NOT DETECTED ug/L (ref 0.0–14.9)

## 2016-03-01 LAB — HIV ANTIBODY (ROUTINE TESTING W REFLEX): HIV SCREEN 4TH GENERATION: NONREACTIVE

## 2016-03-01 LAB — ANGIOTENSIN CONVERTING ENZYME

## 2016-03-01 LAB — ACETYLCHOLINE RECEPTOR, BLOCKING: ACETYLCHOL BLOCK AB: 21 % (ref 0–25)

## 2016-03-01 LAB — VITAMIN B1: THIAMINE: 118.8 nmol/L (ref 66.5–200.0)

## 2016-03-01 LAB — ACETYLCHOLINE RECEPTOR, MODULATING

## 2016-03-01 LAB — METHYLMALONIC ACID, SERUM: METHYLMALONIC ACID: 120 nmol/L (ref 0–378)

## 2016-03-01 NOTE — Telephone Encounter (Signed)
Labs are unremarkable I can discuss in detail at the emg/ncs thanks

## 2016-03-01 NOTE — Telephone Encounter (Signed)
If patient is improving and feeling better we can hold off on the emg/ncs. If she starts getting weaker again she should call us and we can reschedule thanks

## 2016-03-01 NOTE — Telephone Encounter (Signed)
LVM  For pt to call office back. Advised labs unremarkable per Dr Lucia Gaskins note. Asked her to call office back to schedule EMG/NCS. I do not see any appt scheduled. Gave [facility] phone number.

## 2016-03-01 NOTE — Telephone Encounter (Signed)
LVM for pt relaying Dr Lucia Gaskins message below. Gave [facility] phone number if she has any other questions or concerns.

## 2016-03-01 NOTE — Telephone Encounter (Signed)
Dr Lucia Gaskins- pt requesting a call from you specifically. See notes below,thank you

## 2016-03-01 NOTE — Telephone Encounter (Signed)
Pt is requesting other two lab results. Please call and advise

## 2016-03-01 NOTE — Telephone Encounter (Signed)
Patient returned Emma's call regarding scheduling NCS/EMG, patient spoke with Carollee Herter B earlier and advised, she doesn't want to have this test done. Patient states, she has DM and "ios not trying to lose her legs, front of legs are very, very tender, lost ability to walk is now walking again and doesn't want to lose the ability to walk again". Patient requests call back from Dr. Lucia Gaskins. Please call [phone].

## 2016-03-01 NOTE — Telephone Encounter (Signed)
Dr Lucia Gaskins- did the other 2 labs come back yet? Please advise

## 2016-05-29 ENCOUNTER — Ambulatory Visit: Payer: Medicare Other | Admitting: Internal Medicine

## 2016-06-19 ENCOUNTER — Encounter: Payer: Self-pay | Admitting: *Deleted

## 2016-06-19 DIAGNOSIS — J449 Chronic obstructive pulmonary disease, unspecified: Secondary | ICD-10-CM | POA: Insufficient documentation

## 2016-06-19 DIAGNOSIS — F329 Major depressive disorder, single episode, unspecified: Secondary | ICD-10-CM | POA: Insufficient documentation

## 2016-06-19 DIAGNOSIS — F32A Depression, unspecified: Secondary | ICD-10-CM | POA: Insufficient documentation

## 2016-06-19 DIAGNOSIS — F419 Anxiety disorder, unspecified: Secondary | ICD-10-CM | POA: Insufficient documentation

## 2016-07-04 ENCOUNTER — Ambulatory Visit (INDEPENDENT_AMBULATORY_CARE_PROVIDER_SITE_OTHER): Payer: Medicare Other | Admitting: Cardiology

## 2016-07-04 ENCOUNTER — Encounter (INDEPENDENT_AMBULATORY_CARE_PROVIDER_SITE_OTHER): Payer: Self-pay

## 2016-07-04 ENCOUNTER — Encounter: Payer: Self-pay | Admitting: Cardiology

## 2016-07-04 VITALS — BP 139/81 | HR 103 | Ht 62.0 in | Wt 191.8 lb

## 2016-07-04 DIAGNOSIS — E7800 Pure hypercholesterolemia, unspecified: Secondary | ICD-10-CM

## 2016-07-04 DIAGNOSIS — I10 Essential (primary) hypertension: Secondary | ICD-10-CM | POA: Diagnosis not present

## 2016-07-04 DIAGNOSIS — I2510 Atherosclerotic heart disease of native coronary artery without angina pectoris: Secondary | ICD-10-CM | POA: Diagnosis not present

## 2016-07-04 DIAGNOSIS — R002 Palpitations: Secondary | ICD-10-CM | POA: Diagnosis not present

## 2016-07-04 DIAGNOSIS — G4733 Obstructive sleep apnea (adult) (pediatric): Secondary | ICD-10-CM

## 2016-07-04 DIAGNOSIS — R9431 Abnormal electrocardiogram [ECG] [EKG]: Secondary | ICD-10-CM

## 2016-07-04 NOTE — Patient Instructions (Signed)
Medication Instructions:  Your physician recommends that you continue on your current medications as directed. Please refer to the Current Medication list given to you today.   Labwork: Your physician recommends that you return for FASTING lab work the same day as your stress test.   Testing/Procedures: Your physician has requested that you have an echocardiogram. Echocardiography is a painless test that uses sound waves to create images of your heart. It provides your doctor with information about the size and shape of your heart and how well your heart's chambers and valves are working. This procedure takes approximately one hour. There are no restrictions for this procedure.  Your physician has requested that you have a lexiscan myoview. For further information please visit [email or web address] Please follow instruction sheet, as given.  Your physician has recommended that you wear an event monitor. Event monitors are medical devices that record the heart's electrical activity. Doctors most often [place] these monitors to diagnose arrhythmias. Arrhythmias are problems with the speed or rhythm of the heartbeat. The monitor is a small, portable device. You can wear one while you do your normal daily activities. This is usually used to diagnose what is causing palpitations/syncope (passing out).  Your physician has recommended that you have a sleep study. This test records several body functions during sleep, including: brain activity, eye movement, oxygen and carbon dioxide blood levels, heart rate and rhythm, breathing rate and rhythm, the flow of air through your mouth and nose, snoring, body muscle movements, and chest and belly movement.  Follow-Up: Your physician wants you to follow-up in: 1 year with Dr. Mayford Knife. You will receive a reminder letter in the mail two months in advance. If you don't receive a letter, please call our office to schedule the follow-up appointment.   Any Other Special  Instructions Will Be Listed Below (If Applicable).     If you need a refill on your cardiac medications before your next appointment, please call your pharmacy.

## 2016-07-04 NOTE — Progress Notes (Signed)
Cardiology Office Note    Date:  07/04/2016   ID:  Cynthia, Fuller [birth year], MRN [id number]  PCP:  Redmond Baseman, MD  Cardiologist:  Armanda Magic, MD   Chief Complaint  Patient presents with  . Palpitations    History of Present Illness:  Cynthia Fuller is a 71 y.o. female with a history of DM, ASCAD (cath at [facility] 2009 with 30% distal LM, 50-60% heavily calcified mid RCA) on medical management, Hyperlipidemia, HTN and OSA currently not on CPAP who is referred for evaluation of palpitations. She denies any chest pain or pressure, dizziness or syncope.  She has chronic DOE that she attributes to her allergies.  She has no orthopnea but does occasionally have to use her inhaler at night which helps with her breathing.  She occasionally has some LE edema and urinates a lot at night.  She continues to smoke.  She says that her heart feels like it is skipping and sometimes she feels like her heart stops and then when it starts back it is very fast.  This occurs every other day. She does drink caffeine 1 cup daily.    Past Medical History:  Diagnosis Date  . Anxiety   . CAD (coronary artery disease), native coronary artery 2009   cath at [facility] 2009 with 30% distal LM, 50-60% heavily calcified mid RCA on medical management,  . COPD (chronic obstructive pulmonary disease) (HCC)   . Depression   . Diabetes mellitus without complication (HCC)   . Hypercholesteremia   . Hypertension   . Rheumatoid arthritis (HCC)   . Sleep apnea    intolerant to CPAP    Past Surgical History:  Procedure Laterality Date  . ABLATION  2000/2002?    Current Medications: Outpatient Medications Prior to Visit  Medication Sig Dispense Refill  . ALPRAZolam (XANAX) 0.5 MG tablet Take 1 tablet (0.5 mg total) by mouth 3 (three) times daily as needed for anxiety. (Patient taking differently: Take 0.5 mg by mouth as needed for anxiety. 1 tablet QHS PRN) 24 tablet 0  . aspirin EC 81 MG tablet  Take 81 mg by mouth daily.    . benazepril-hydrochlorthiazide (LOTENSIN HCT) 20-12.5 MG tablet Take 1 tablet by mouth daily.    [name] HYDROcodone-acetaminophen (NORCO/VICODIN) 5-325 MG tablet Take 1 tablet by mouth every 6 (six) hours as needed for moderate pain.    [name] ibuprofen (ADVIL,MOTRIN) 200 MG tablet Take 200 mg by mouth every 6 (six) hours as needed (pain).     . insulin aspart (NOVOLOG) 100 UNIT/ML injection Inject 0-10 Units into the skin 3 (three) times daily before meals. 100-150=5 units 151-200=8 units >=201 = 10 units (Patient taking differently: Inject as per sliding scale: if 0-149=0; 150-600=5, subcutaneously before meals) 3 vial PRN  . insulin glargine (LANTUS) 100 UNIT/ML injection Inject 35 Units into the skin at bedtime.    . Multiple Vitamins-Minerals (MULTIVITAMIN WITH MINERALS) tablet Take 1 tablet by mouth daily.    . polyethylene glycol (MIRALAX) packet Take 17 g by mouth daily. 14 each 0  . Ipratropium-Albuterol (COMBIVENT RESPIMAT) 20-100 MCG/ACT AERS respimat Inhale 1 puff into the lungs every 6 (six) hours as needed for wheezing.    . promethazine (PHENERGAN) 25 MG tablet Take 25 mg by mouth every 6 (six) hours as needed for nausea or vomiting.     No facility-administered medications prior to visit.      Allergies:   Neomycin and Other   Social History  Social History  . Marital status: Divorced    Spouse name: N/A  . Number of children: 5  . Years of education: 12   Occupational History  . unemployed    Social History Main Topics  . Smoking status: Smoker, Current Status Unknown    Packs/day: 0.30    Types: Cigarettes  . Smokeless tobacco: Former Neurosurgeon    Quit date: 10/20/2015     Comment: 1 pack every 3 days  . Alcohol use No  . Drug use: No  . Sexual activity: Not Asked   Other Topics Concern  . None   Social History Narrative   Lives alone   Caffeine use: Drinks coffee daily    tea/soda ocass        Family History:  The patient's family  history includes Alcohol abuse in her sister; Diabetes in her brother and mother; Drug abuse in her sister; Hearing loss in her mother; Hypertension in her father and mother; Kidney disease in her brother; Osteoporosis in her mother; Pancreatic cancer in her father.   ROS:   Please see the history of present illness.    ROS All other systems reviewed and are negative.  No flowsheet data found.     PHYSICAL EXAM:   VS:  BP 139/81   Pulse (!) 103   Ht 5\' 2"  (1.575 m)   Wt 191 lb 12.8 oz (87 kg)   BMI 35.08 kg/m    GEN: Well nourished, well developed, in no acute distress  HEENT: normal  Neck: no JVD, carotid bruits, or masses Cardiac: RRR; no murmurs, rubs, or gallops,no edema.  Intact distal pulses bilaterally.  Respiratory:  clear to auscultation bilaterally, normal work of breathing GI: soft, nontender, nondistended, + BS MS: no deformity or atrophy  Skin: warm and dry, no rash Neuro:  Alert and Oriented x 3, Strength and sensation are intact Psych: euthymic mood, full affect  Wt Readings from Last 3 Encounters:  07/04/16 191 lb 12.8 oz (87 kg)  02/15/16 185 lb 3.2 oz (84 kg)  11/02/15 197 lb 6.4 oz (89.5 kg)      Studies/Labs Reviewed:   EKG:  EKG is ordered today.  The ekg ordered today demonstrates sinus tachycardia at 103bpm with inferolateral  ST/T wave abnormality which is new.  Recent Labs: 11/01/2015: ALT 18; Hemoglobin 12.6; Platelets 341 11/02/2015: TSH 1.705 11/03/2015: BUN 5; Creatinine, Ser 0.70; Potassium 3.8; Sodium 139   Lipid Panel    Component Value Date/Time   TRIG 71 11/02/2015 0644    Additional studies/ records that were reviewed today include:  none    ASSESSMENT:    1. Coronary artery disease involving native coronary artery of native heart without angina pectoris   2. Essential hypertension   3. Pure hypercholesterolemia   4. Heart palpitations   5. OSA (obstructive sleep apnea)      PLAN:  In order of problems listed  above:  1. ASCAD - cath at [facility] 2009 with 30% distal LM, 50-60% heavily calcified mid RCA  on medical management. She had been followed by Cardiology at [facility] but moved back to [place].  She has chronic DOE that likely is related to her obesity and ongoing tobacco use.  Given her history of nonobstructive CAD, DOE and new EKG changes, I think we should get a Lexsican myoview to rule out ischemia. She will continue on ASA.  She is not on a statin due to intolerance.  I will get a 2D echo to  assess LVF.    2. HTN - BP controlled on current meds.  Continue ACE I and diuretic. Check BMET  3. Hyperlipidemia with LDL goal < 70.  She is statin intolerant.  I will refer her to lipid clinic to discuss [facility]CS[facility] 9 drugs. I will set her up for an FLP and ALT.  4. Heart palpitations - She has had this evaluated in 2016 which showed no evidence of afib and only sinus tachycardia or NSR when wearing the monitor and complaining of palpitations.  Her EKG today showed sinus tachycardia at 103bpm.  5. OSA - she has been intolerant to CPAP in the past mainly with the inability to find a mask that fits.  She wants to try the CPAP again.  I will set her up with a split night study and CPAP titration to make sure she is on the right pressure.      Medication Adjustments/Labs and Tests Ordered: Current medicines are reviewed at length with the patient today.  Concerns regarding medicines are outlined above.  Medication changes, Labs and Tests ordered today are listed in the Patient Instructions below.  There are no Patient Instructions on file for this visit.   Signed, Armanda Magic, MD  07/04/2016 1:51 PM    [facility] [address] [place], [place], [place]  [address] Phone: [phone]; Fax: [phone]

## 2016-07-13 ENCOUNTER — Telehealth (HOSPITAL_COMMUNITY): Payer: Self-pay | Admitting: *Deleted

## 2016-07-13 NOTE — Telephone Encounter (Signed)
Left message on voicemail per DPR in reference to upcoming appointment scheduled on 07/17/16 with detailed instructions given per Myocardial Perfusion Study Information Sheet for the test. LM to arrive 15 minutes early, and that it is imperative to arrive on time for appointment to keep from having the test rescheduled. If you need to cancel or reschedule your appointment, please call the office within 24 hours of your appointment. Failure to do so may result in a cancellation of your appointment, and a $50 no show fee. Phone number given for call back for any questions. Cynthia Fuller

## 2016-07-17 ENCOUNTER — Ambulatory Visit (HOSPITAL_BASED_OUTPATIENT_CLINIC_OR_DEPARTMENT_OTHER): Payer: Medicare Other

## 2016-07-17 ENCOUNTER — Ambulatory Visit (INDEPENDENT_AMBULATORY_CARE_PROVIDER_SITE_OTHER): Payer: Medicare Other

## 2016-07-17 ENCOUNTER — Other Ambulatory Visit: Payer: Self-pay

## 2016-07-17 ENCOUNTER — Ambulatory Visit (HOSPITAL_COMMUNITY): Payer: Medicare Other | Attending: Cardiology

## 2016-07-17 ENCOUNTER — Other Ambulatory Visit: Payer: Medicare Other | Admitting: *Deleted

## 2016-07-17 DIAGNOSIS — R002 Palpitations: Secondary | ICD-10-CM

## 2016-07-17 DIAGNOSIS — I10 Essential (primary) hypertension: Secondary | ICD-10-CM

## 2016-07-17 DIAGNOSIS — I071 Rheumatic tricuspid insufficiency: Secondary | ICD-10-CM | POA: Diagnosis not present

## 2016-07-17 DIAGNOSIS — I700 Atherosclerosis of aorta: Secondary | ICD-10-CM | POA: Insufficient documentation

## 2016-07-17 DIAGNOSIS — I2510 Atherosclerotic heart disease of native coronary artery without angina pectoris: Secondary | ICD-10-CM | POA: Insufficient documentation

## 2016-07-17 DIAGNOSIS — R9431 Abnormal electrocardiogram [ECG] [EKG]: Secondary | ICD-10-CM | POA: Insufficient documentation

## 2016-07-17 DIAGNOSIS — E7800 Pure hypercholesterolemia, unspecified: Secondary | ICD-10-CM

## 2016-07-17 LAB — HEPATIC FUNCTION PANEL
ALK PHOS: 70 IU/L (ref 39–117)
ALT: 14 IU/L (ref 0–32)
AST: 18 IU/L (ref 0–40)
Albumin: 3.6 g/dL (ref 3.5–4.8)
BILIRUBIN, DIRECT: 0.02 mg/dL (ref 0.00–0.40)
Bilirubin Total: 0.3 mg/dL (ref 0.0–1.2)
TOTAL PROTEIN: 7.1 g/dL (ref 6.0–8.5)

## 2016-07-17 LAB — LIPID PANEL
Chol/HDL Ratio: 4.3 ratio units (ref 0.0–4.4)
Cholesterol, Total: 186 mg/dL (ref 100–199)
HDL: 43 mg/dL (ref >39)
LDL CALC: 118 mg/dL — AB (ref 0–99)
TRIGLYCERIDES: 123 mg/dL (ref 0–149)
VLDL CHOLESTEROL CAL: 25 mg/dL (ref 5–40)

## 2016-07-17 LAB — BASIC METABOLIC PANEL
BUN/Creatinine Ratio: 21 (ref 12–28)
BUN: 15 mg/dL (ref 8–27)
CHLORIDE: 100 mmol/L (ref 96–106)
CO2: 24 mmol/L (ref 18–29)
CREATININE: 0.71 mg/dL (ref 0.57–1.00)
Calcium: 8.8 mg/dL (ref 8.7–10.3)
GFR calc Af Amer: 99 mL/min/{1.73_m2} (ref >59)
GFR calc non Af Amer: 86 mL/min/{1.73_m2} (ref >59)
GLUCOSE: 229 mg/dL — AB (ref 65–99)
Potassium: 4.9 mmol/L (ref 3.5–5.2)
Sodium: 138 mmol/L (ref 134–144)

## 2016-07-17 LAB — MYOCARDIAL PERFUSION IMAGING
CHL CUP NUCLEAR SDS: 6
CHL CUP NUCLEAR SSS: 9
LHR: 0.32
LV sys vol: 57 mL
LVDIAVOL: 94 mL (ref 46–106)
NUC STRESS TID: 1.01
Peak HR: 100 {beats}/min
Rest HR: 82 {beats}/min
SRS: 3

## 2016-07-17 MED ORDER — TECHNETIUM TC 99M TETROFOSMIN IV KIT
10.9000 | PACK | Freq: Once | INTRAVENOUS | Status: AC | PRN
  Administered 2016-07-17: 10.9 via INTRAVENOUS
  Filled 2016-07-17: qty 11

## 2016-07-17 MED ORDER — TECHNETIUM TC 99M TETROFOSMIN IV KIT
31.3000 | PACK | Freq: Once | INTRAVENOUS | Status: AC | PRN
  Administered 2016-07-17: 31.3 via INTRAVENOUS
  Filled 2016-07-17: qty 32

## 2016-07-17 MED ORDER — REGADENOSON 0.4 MG/5ML IV SOLN
0.4000 mg | Freq: Once | INTRAVENOUS | Status: AC
  Administered 2016-07-17: 0.4 mg via INTRAVENOUS

## 2016-07-18 ENCOUNTER — Telehealth: Payer: Self-pay

## 2016-07-18 DIAGNOSIS — I5189 Other ill-defined heart diseases: Secondary | ICD-10-CM

## 2016-07-18 NOTE — Addendum Note (Signed)
Addended by: Gunnar Fusi A on: 07/18/2016 10:06 AM   Modules accepted: Orders

## 2016-07-18 NOTE — Telephone Encounter (Signed)
-----   Message from Quintella Reichert, MD sent at 07/17/2016 10:08 AM EST ----- I suspect her DOE is more related to obesity and sedentary state but given evidence of diastolic dysfunction please get a BNP

## 2016-07-18 NOTE — Telephone Encounter (Addendum)
Informed patient of results and verbal understanding expressed.   BNP scheduled 2/16. Patient agrees with treatment plan.   Patient reports she STOPPED LOTENSIN back in September.  Her PCP started her on Lisinopril 10 mg last week.  She is watching her weights, but says they fluctuate daily a "few pounds." She is adamant her scale is correct since it is [employer]. She also states she has swelling around her ankles that isn't new. She is surprised her MD didn't put her on another diuretic when he stopped the Lotensin. Instructed patient to continue to weigh daily in the morning after going to the backroom and prior to eating. She will call if she gains 3 pounds in 24 hours or 5 pounds in one week. She understands she will be called if Dr. Mayford Knife has further instructions prior to [facility] visit and BNP lab draw next Friday.   Med list updated.

## 2016-07-19 ENCOUNTER — Telehealth: Payer: Self-pay

## 2016-07-19 NOTE — Telephone Encounter (Signed)
Received patient's daily weight log she dropped off at check-in.  1/23: 185.8 lbs 1/24: 188.8 lbs 1/25: 187.4 lbs 1/26: 191.8 lbs 1/27: 191.8 lbs 1/28: 191 lbs  Patient states her weights are "all over the place" and she doesn't feel "anymore swollen than usual." Instructed patient to continue to continue to monitor weights. She understands to weigh in the morning after she urinates, without clothes, and before breakfast. She will call if she gains 3 pounds in 1 day or 5 pounds in 1 week.

## 2016-07-26 ENCOUNTER — Telehealth: Payer: Self-pay | Admitting: Cardiology

## 2016-07-26 NOTE — Telephone Encounter (Signed)
New Message  Pt call requesting speak with RN. Pt states her leeds are causing her skin to become raw. Please call back to discuss

## 2016-07-27 ENCOUNTER — Telehealth: Payer: Self-pay | Admitting: *Deleted

## 2016-07-27 NOTE — Telephone Encounter (Signed)
LMVM Received message she is having reaction to [employer] patch and had to remove monitor.  Do not advise putting creams or ointment on area.  It is best for skin to be exposed to air to dry and heal.  If she has itching a hydrocortisone  cream may be used.  I recommend she contact [employer] at [phone] and request another type of monitor be shipped to her along with Soft-E sensitive skin electrodes.  These electrodes will be placed in other locations than where the patch was placed.

## 2016-07-27 NOTE — Telephone Encounter (Signed)
Patient called. No answer. Left message on her voice mail regarding [employer] patch skin reaction.  See telephone note.

## 2016-07-27 NOTE — Telephone Encounter (Signed)
Follow up      Pt states that she can no longer wear the monitor.  She is taking it off.  Also her skin is very irritated from the patches.  What can she put on it?  Pt is allergic to neomycin.  Please call

## 2016-07-28 ENCOUNTER — Other Ambulatory Visit: Payer: Medicare Other

## 2016-07-28 ENCOUNTER — Ambulatory Visit: Payer: Medicare Other

## 2016-08-09 ENCOUNTER — Ambulatory Visit: Payer: Medicare Other | Admitting: Dietician

## 2016-08-21 ENCOUNTER — Encounter (HOSPITAL_BASED_OUTPATIENT_CLINIC_OR_DEPARTMENT_OTHER): Payer: Medicare Other

## 2016-09-04 ENCOUNTER — Ambulatory Visit (HOSPITAL_BASED_OUTPATIENT_CLINIC_OR_DEPARTMENT_OTHER): Payer: Medicare Other | Attending: Cardiology | Admitting: Cardiology

## 2016-09-04 DIAGNOSIS — G4733 Obstructive sleep apnea (adult) (pediatric): Secondary | ICD-10-CM

## 2016-09-18 NOTE — Procedures (Signed)
 Patient Name: Cynthia Fuller, Cynthia Fuller Study Date: 09/04/2016 Gender: Female D.O.B: 07/18/1945 Age (years): 70 Referring Provider: Traci Turner MD, ABSM Height (inches): 62 Interpreting Physician: Traci Turner MD, ABSM Weight (lbs): 190 RPSGT: Spruill, Vicki BMI: 35 MRN: [id number] Neck Size: 15.00  CLINICAL INFORMATION Sleep Study Type: Split Night CPAP  Indication for sleep study: Diabetes, Excessive Daytime Sleepiness, Fatigue, Hypertension, Obesity, OSA, Snoring  Epworth Sleepiness Score: 6  SLEEP STUDY TECHNIQUE As per the AASM Manual for the Scoring of Sleep and Associated Events v2.3 (April 2016) with a hypopnea requiring 4% desaturations.  The channels recorded and monitored were frontal, central and occipital EEG, electrooculogram (EOG), submentalis EMG (chin), nasal and oral airflow, thoracic and abdominal wall motion, anterior tibialis EMG, snore microphone, electrocardiogram, and pulse oximetry. Continuous positive airway pressure (CPAP) was initiated when the patient met split night criteria and was titrated according to treat sleep-disordered breathing.  MEDICATIONS Medications self-administered by patient taken the night of the study : HUMALOG, LANTUS, COMBIVENT  RESPIRATORY PARAMETERS Diagnostic Total AHI (/hr): 16.0  RDI (/hr):17.7  OA Index (/hr): 6.5  CA Index (/hr): 0.0 REM AHI (/hr): N/A  NREM AHI (/hr):16.0  Supine AHI (/hr):N/A  Non-supine AHI (/hr):15.97 Min O2 Sat (%):80.00  Mean O2 (%): 92.68  Time below 88% (min):3.8    Titration Optimal Pressure (cm):14  AHI at Optimal Pressure (/hr):2.4  Min O2 at Optimal Pressure (%):91.0 Supine % at Optimal (%):100  Sleep % at Optimal (%):98    SLEEP ARCHITECTURE The recording time for the entire night was 383.0 minutes.  During a baseline period of 173.0 minutes, the patient slept for 139.0 minutes in REM and nonREM, yielding a sleep efficiency of 80.4%. Sleep onset after lights out was 5.4 minutes with  a REM latency of N/A minutes. The patient spent 12.23% of the night in stage N1 sleep, 87.77% in stage N2 sleep, 0.00% in stage N3 and 0.00% in REM.  During the titration period of 209.1 minutes, the patient slept for 202.1 minutes in REM and nonREM, yielding a sleep efficiency of 96.7%. Sleep onset after CPAP initiation was 3.0 minutes with a REM latency of 72.5 minutes. The patient spent 2.47% of the night in stage N1 sleep, 71.30% in stage N2 sleep, 0.00% in stage N3 and 26.23% in REM.  CARDIAC DATA The 2 lead EKG demonstrated sinus rhythm. The mean heart rate was 81.95 beats per minute. Other EKG findings include: PVCs.  LEG MOVEMENT DATA The total Periodic Limb Movements of Sleep (PLMS) were 0. The PLMS index was 0.00 .  IMPRESSIONS - Moderate obstructive sleep apnea occurred during the diagnostic portion of the study(AHI = 16.0/hour). An optimal PAP pressure was selected for this patient ( 14 cm of water) - No significant central sleep apnea occurred during the diagnostic portion of the study (CAI = 0.0/hour). - Mild oxygen desaturation was noted during the diagnostic portion of the study (Min O2 = 80.00%). - The patient snored with Moderate snoring volume during the diagnostic portion of the study. - EKG findings include PVCs. - Clinically significant periodic limb movements did not occur during sleep.  DIAGNOSIS - Obstructive Sleep Apnea (327.23 [G47.33 ICD-10])  RECOMMENDATIONS - Trial of CPAP therapy on 14 cm H2O with a Medium size Philips Respironics Full Face Mask Dreamwear mask and heated humidification. - Avoid alcohol, sedatives and other CNS depressants that may worsen sleep apnea and disrupt normal sleep architecture. - Sleep hygiene should be reviewed to assess factors that may improve sleep quality. -   Weight management and regular exercise should be initiated or continued. - Return to Sleep Center for re-evaluation after 10 weeks of therapy   Traci Turner Diplomate,  American Board of Sleep Medicine  ELECTRONICALLY SIGNED ON:  09/18/2016, 10:06 PM [facility] PH: [phone]   FX: [phone] ACCREDITED BY THE AMERICAN ACADEMY OF SLEEP MEDICINE 

## 2016-09-19 ENCOUNTER — Telehealth: Payer: Self-pay

## 2016-09-19 NOTE — Telephone Encounter (Signed)
-----   Message from Quintella Reichert, MD sent at 09/18/2016 10:10 PM EDT ----- Please let patient know that they have significant sleep apnea and had successful CPAP titration and will be set up with CPAP unit.  Please let DME know that order is in EPIC.  Please set patient up for OV in 10 weeks

## 2016-09-19 NOTE — Telephone Encounter (Signed)
Informed patient of results and verbal understanding expressed.   She understands Dr. Mayford Knife has placed an order for new PAP at 14 cm H2O. She understands to call if [facility] does not contact her with new set-up in a timely manner. She understands she will be called once confirmation has been received from [facility] that she has received new machine to schedule 10 week follow-up appointment. She was grateful for call and agrees with treatment plan.  Message sent to [facility] to set up patient with new device.

## 2016-10-16 ENCOUNTER — Encounter: Payer: Medicare Other | Attending: Family Medicine | Admitting: *Deleted

## 2016-10-16 ENCOUNTER — Encounter: Payer: Self-pay | Admitting: *Deleted

## 2016-10-16 NOTE — Progress Notes (Signed)
Diabetes Self-Management Education  Visit Type: First/Initial  Appt. Start Time: 0745 Appt. End Time: 0830  10/16/2016  Cynthia Fuller, identified by name and date of birth, is a 71 y.o. female with a diagnosis of Diabetes: Type 2.   ASSESSMENT  Cynthia Fuller's originally had an appointment on 5/10 with Pinc[facility] Large, but showed up on 5/7 and stated she would not come back if she was not seen that day.  She did agree to a much abbreviated visit as there was not enough time for her full visit.  She is on a very limited income and complains of food insecurity.  She is learning how to walk again after being unable to do so last year, but is still in severe pain.  She is stressed by the demands of her family      Diabetes Self-Management Education - 10/16/16 0752      Visit Information   Visit Type First/Initial     Initial Visit   Diabetes Type Type 2   Are you currently following a meal plan? No   Are you taking your medications as prescribed? Yes     Psychosocial Assessment   Patient Concerns Nutrition/Meal planning;Other (comment)  stress management     Complications   Last HgB A1C per patient/outside source 8.5 %  not in record.  patient believes her A1C is 8. something   How often do you check your blood sugar? 3-4 times/day   Fasting Blood glucose range (mg/dL) 20-100   Postprandial Blood glucose range (mg/dL) 712-197   Number of hypoglycemic episodes per month 3   Can you tell when your blood sugar is low? Yes   What do you do if your blood sugar is low? check glucose then eats something sweet   Number of hyperglycemic episodes per week 2   Can you tell when your blood sugar is high? Yes   What do you do if your blood sugar is high? give insulin   Have you had a dilated eye exam in the past 12 months? No   Have you had a dental exam in the past 12 months? No   Are you checking your feet? No     Dietary Intake   Breakfast smoke sausage on 2 slices wheat bread   Lunch  small fries and small diet coke   Dinner chopped BBQ, chocolate candy   Beverage(s) 2 oz milk??; water     Exercise   Exercise Type ADL's     Patient Education   Previous Diabetes Education Yes (please comment)   Disease state  Factors that contribute to the development of diabetes   Nutrition management  Role of diet in the treatment of diabetes and the relationship between the three main macronutrients and blood glucose level   Chronic complications Relationship between chronic complications and blood glucose control;Assessed and discussed foot care and prevention of foot problems   Psychosocial adjustment Worked with patient to identify barriers to care and solutions;Role of stress on diabetes     Individualized Goals (developed by patient)   Nutrition Follow meal plan discussed   Medications take my medication as prescribed   Monitoring  test my blood glucose as discussed   Health Coping ask for help with (comment)  get family member to check feet     Post-Education Assessment   Patient understands the diabetes disease and treatment process. Needs Review   Patient understands incorporating nutritional management into lifestyle. Needs Review   Patient undertands incorporating  physical activity into lifestyle. Needs Review   Patient understands using medications safely. Needs Review   Patient understands monitoring blood glucose, interpreting and using results Needs Review   Patient understands prevention, detection, and treatment of acute complications. Needs Review   Patient understands prevention, detection, and treatment of chronic complications. Needs Review   Patient understands how to develop strategies to address psychosocial issues. Needs Review   Patient understands how to develop strategies to promote health/change behavior. Needs Review     Outcomes   Expected Outcomes Demonstrated interest in learning. Expect positive outcomes   Future DMSE PRN   Program Status Not  Completed      Individualized Plan for Diabetes Self-Management Training:   Learning Objective:  Patient will have a greater understanding of diabetes self-management. Patient education plan is to attend individual and/or group sessions per assessed needs and concerns.   Plan:   Use MyPlate for meal planning.  Increase vegetables   Expected Outcomes:  Demonstrated interest in learning. Expect positive outcomes  Education material provided: Living Well with Diabetes  If problems or questions, patient to contact team via:  Phone  Future DSME appointment: PRN with [facility] Large

## 2016-10-17 ENCOUNTER — Other Ambulatory Visit (HOSPITAL_COMMUNITY): Payer: Medicare Other

## 2016-10-17 ENCOUNTER — Institutional Professional Consult (permissible substitution): Payer: Medicare Other | Admitting: Family Medicine

## 2016-10-17 ENCOUNTER — Inpatient Hospital Stay (HOSPITAL_COMMUNITY)
Admission: EM | Admit: 2016-10-17 | Discharge: 2016-11-10 | DRG: 919 | Disposition: E | Payer: Medicare Other | Attending: Emergency Medicine | Admitting: Emergency Medicine

## 2016-10-17 ENCOUNTER — Encounter (HOSPITAL_COMMUNITY): Payer: Self-pay | Admitting: Emergency Medicine

## 2016-10-17 ENCOUNTER — Emergency Department (HOSPITAL_COMMUNITY): Payer: Medicare Other

## 2016-10-17 DIAGNOSIS — T85618A Breakdown (mechanical) of other specified internal prosthetic devices, implants and grafts, initial encounter: Secondary | ICD-10-CM | POA: Diagnosis present

## 2016-10-17 DIAGNOSIS — J9601 Acute respiratory failure with hypoxia: Secondary | ICD-10-CM | POA: Diagnosis present

## 2016-10-17 DIAGNOSIS — G4733 Obstructive sleep apnea (adult) (pediatric): Secondary | ICD-10-CM | POA: Diagnosis present

## 2016-10-17 DIAGNOSIS — E872 Acidosis, unspecified: Secondary | ICD-10-CM

## 2016-10-17 DIAGNOSIS — N179 Acute kidney failure, unspecified: Secondary | ICD-10-CM | POA: Diagnosis present

## 2016-10-17 DIAGNOSIS — R570 Cardiogenic shock: Secondary | ICD-10-CM | POA: Diagnosis present

## 2016-10-17 DIAGNOSIS — I4901 Ventricular fibrillation: Secondary | ICD-10-CM | POA: Diagnosis present

## 2016-10-17 DIAGNOSIS — G9341 Metabolic encephalopathy: Secondary | ICD-10-CM | POA: Diagnosis present

## 2016-10-17 DIAGNOSIS — I10 Essential (primary) hypertension: Secondary | ICD-10-CM | POA: Diagnosis present

## 2016-10-17 DIAGNOSIS — J441 Chronic obstructive pulmonary disease with (acute) exacerbation: Secondary | ICD-10-CM | POA: Diagnosis present

## 2016-10-17 DIAGNOSIS — R092 Respiratory arrest: Secondary | ICD-10-CM

## 2016-10-17 DIAGNOSIS — Z794 Long term (current) use of insulin: Secondary | ICD-10-CM

## 2016-10-17 DIAGNOSIS — E7800 Pure hypercholesterolemia, unspecified: Secondary | ICD-10-CM | POA: Diagnosis present

## 2016-10-17 DIAGNOSIS — Y92009 Unspecified place in unspecified non-institutional (private) residence as the place of occurrence of the external cause: Secondary | ICD-10-CM

## 2016-10-17 DIAGNOSIS — Z800 Family history of malignant neoplasm of digestive organs: Secondary | ICD-10-CM

## 2016-10-17 DIAGNOSIS — Z811 Family history of alcohol abuse and dependence: Secondary | ICD-10-CM

## 2016-10-17 DIAGNOSIS — Z91048 Other nonmedicinal substance allergy status: Secondary | ICD-10-CM

## 2016-10-17 DIAGNOSIS — Z6838 Body mass index (BMI) 38.0-38.9, adult: Secondary | ICD-10-CM

## 2016-10-17 DIAGNOSIS — R4020 Unspecified coma: Secondary | ICD-10-CM | POA: Diagnosis present

## 2016-10-17 DIAGNOSIS — I2510 Atherosclerotic heart disease of native coronary artery without angina pectoris: Secondary | ICD-10-CM | POA: Diagnosis present

## 2016-10-17 DIAGNOSIS — Y828 Other medical devices associated with adverse incidents: Secondary | ICD-10-CM | POA: Diagnosis present

## 2016-10-17 DIAGNOSIS — Z841 Family history of disorders of kidney and ureter: Secondary | ICD-10-CM

## 2016-10-17 DIAGNOSIS — Z7982 Long term (current) use of aspirin: Secondary | ICD-10-CM

## 2016-10-17 DIAGNOSIS — Z833 Family history of diabetes mellitus: Secondary | ICD-10-CM

## 2016-10-17 DIAGNOSIS — G931 Anoxic brain damage, not elsewhere classified: Secondary | ICD-10-CM | POA: Diagnosis present

## 2016-10-17 DIAGNOSIS — J811 Chronic pulmonary edema: Secondary | ICD-10-CM | POA: Diagnosis present

## 2016-10-17 DIAGNOSIS — Z8262 Family history of osteoporosis: Secondary | ICD-10-CM

## 2016-10-17 DIAGNOSIS — I469 Cardiac arrest, cause unspecified: Secondary | ICD-10-CM | POA: Diagnosis present

## 2016-10-17 DIAGNOSIS — F419 Anxiety disorder, unspecified: Secondary | ICD-10-CM | POA: Diagnosis present

## 2016-10-17 DIAGNOSIS — J9602 Acute respiratory failure with hypercapnia: Secondary | ICD-10-CM | POA: Diagnosis present

## 2016-10-17 DIAGNOSIS — Z8249 Family history of ischemic heart disease and other diseases of the circulatory system: Secondary | ICD-10-CM

## 2016-10-17 DIAGNOSIS — Z91018 Allergy to other foods: Secondary | ICD-10-CM

## 2016-10-17 DIAGNOSIS — R001 Bradycardia, unspecified: Secondary | ICD-10-CM | POA: Diagnosis present

## 2016-10-17 DIAGNOSIS — M069 Rheumatoid arthritis, unspecified: Secondary | ICD-10-CM | POA: Diagnosis present

## 2016-10-17 DIAGNOSIS — F17210 Nicotine dependence, cigarettes, uncomplicated: Secondary | ICD-10-CM | POA: Diagnosis present

## 2016-10-17 DIAGNOSIS — E785 Hyperlipidemia, unspecified: Secondary | ICD-10-CM | POA: Diagnosis present

## 2016-10-17 DIAGNOSIS — E119 Type 2 diabetes mellitus without complications: Secondary | ICD-10-CM | POA: Diagnosis present

## 2016-10-17 DIAGNOSIS — J810 Acute pulmonary edema: Secondary | ICD-10-CM | POA: Diagnosis present

## 2016-10-17 DIAGNOSIS — R402432 Glasgow coma scale score 3-8, at arrival to emergency department: Secondary | ICD-10-CM | POA: Diagnosis present

## 2016-10-17 DIAGNOSIS — J9620 Acute and chronic respiratory failure, unspecified whether with hypoxia or hypercapnia: Secondary | ICD-10-CM

## 2016-10-17 DIAGNOSIS — Z881 Allergy status to other antibiotic agents status: Secondary | ICD-10-CM

## 2016-10-17 DIAGNOSIS — Z791 Long term (current) use of non-steroidal anti-inflammatories (NSAID): Secondary | ICD-10-CM

## 2016-10-17 LAB — I-STAT ARTERIAL BLOOD GAS, ED
ACID-BASE DEFICIT: 9 mmol/L — AB (ref 0.0–2.0)
Bicarbonate: 21.6 mmol/L (ref 20.0–28.0)
O2 Saturation: 100 %
PH ART: 7.057 — AB (ref 7.350–7.450)
PO2 ART: 379 mmHg — AB (ref 83.0–108.0)
TCO2: 24 mmol/L (ref 0–100)
pCO2 arterial: 76.9 mmHg (ref 32.0–48.0)

## 2016-10-17 LAB — CBC WITH DIFFERENTIAL/PLATELET
BASOS ABS: 0 10*3/uL (ref 0.0–0.1)
BASOS PCT: 1 %
EOS ABS: 0.1 10*3/uL (ref 0.0–0.7)
EOS PCT: 1 %
HCT: 35.5 % — ABNORMAL LOW (ref 36.0–46.0)
Hemoglobin: 11 g/dL — ABNORMAL LOW (ref 12.0–15.0)
Lymphocytes Relative: 70 %
Lymphs Abs: 3.6 10*3/uL (ref 0.7–4.0)
MCH: 28.6 pg (ref 26.0–34.0)
MCHC: 31 g/dL (ref 30.0–36.0)
MCV: 92.2 fL (ref 78.0–100.0)
Monocytes Absolute: 0.1 10*3/uL (ref 0.1–1.0)
Monocytes Relative: 2 %
Neutro Abs: 1.4 10*3/uL — ABNORMAL LOW (ref 1.7–7.7)
Neutrophils Relative %: 26 %
PLATELETS: 192 10*3/uL (ref 150–400)
RBC: 3.85 MIL/uL — AB (ref 3.87–5.11)
RDW: 16.8 % — ABNORMAL HIGH (ref 11.5–15.5)
WBC: 5.2 10*3/uL (ref 4.0–10.5)

## 2016-10-17 LAB — COMPREHENSIVE METABOLIC PANEL
ALBUMIN: 2.9 g/dL — AB (ref 3.5–5.0)
ALT: 650 U/L — AB (ref 14–54)
AST: 622 U/L — ABNORMAL HIGH (ref 15–41)
Alkaline Phosphatase: 71 U/L (ref 38–126)
Anion gap: 12 (ref 5–15)
BUN: 10 mg/dL (ref 6–20)
CHLORIDE: 109 mmol/L (ref 101–111)
CO2: 23 mmol/L (ref 22–32)
CREATININE: 1.17 mg/dL — AB (ref 0.44–1.00)
Calcium: 8.9 mg/dL (ref 8.9–10.3)
GFR calc non Af Amer: 46 mL/min — ABNORMAL LOW (ref >60)
GFR, EST AFRICAN AMERICAN: 53 mL/min — AB (ref >60)
Glucose, Bld: 293 mg/dL — ABNORMAL HIGH (ref 65–99)
Potassium: 4.9 mmol/L (ref 3.5–5.1)
SODIUM: 144 mmol/L (ref 135–145)
Total Bilirubin: 0.5 mg/dL (ref 0.3–1.2)
Total Protein: 7 g/dL (ref 6.5–8.1)

## 2016-10-17 LAB — BRAIN NATRIURETIC PEPTIDE: B NATRIURETIC PEPTIDE 5: 297 pg/mL — AB (ref 0.0–100.0)

## 2016-10-17 LAB — I-STAT CG4 LACTIC ACID, ED: Lactic Acid, Venous: 7.09 mmol/L (ref 0.5–1.9)

## 2016-10-17 LAB — I-STAT TROPONIN, ED: Troponin i, poc: 0.07 ng/mL (ref 0.00–0.08)

## 2016-10-17 MED ORDER — SODIUM CHLORIDE 0.9 % IV BOLUS (SEPSIS)
1000.0000 mL | Freq: Once | INTRAVENOUS | Status: AC
  Administered 2016-10-17: 1000 mL via INTRAVENOUS

## 2016-10-17 MED ORDER — ALBUTEROL SULFATE (2.5 MG/3ML) 0.083% IN NEBU
2.5000 mg | INHALATION_SOLUTION | RESPIRATORY_TRACT | Status: DC | PRN
  Administered 2016-10-17: 2.5 mg via RESPIRATORY_TRACT
  Filled 2016-10-17: qty 3

## 2016-10-17 MED ORDER — ROCURONIUM BROMIDE 50 MG/5ML IV SOLN
100.0000 mg | Freq: Once | INTRAVENOUS | Status: AC
  Administered 2016-10-17: 100 mg via INTRAVENOUS

## 2016-10-17 MED ORDER — HEPARIN SODIUM (PORCINE) 5000 UNIT/ML IJ SOLN: 5000.0000 [IU] | Freq: Three times a day (TID) | INTRAMUSCULAR | Status: DC

## 2016-10-17 MED ORDER — DEXTROSE 5 % IV SOLN
1.0000 g | Freq: Once | INTRAVENOUS | Status: AC
  Administered 2016-10-17: 1 g via INTRAVENOUS
  Filled 2016-10-17: qty 10

## 2016-10-17 MED ORDER — EPINEPHRINE PF 1 MG/10ML IJ SOSY
PREFILLED_SYRINGE | INTRAMUSCULAR | Status: AC | PRN
  Administered 2016-10-17 (×4): 1 mg via INTRAVENOUS

## 2016-10-17 MED ORDER — SODIUM CHLORIDE 0.9 % IV SOLN
1.0000 ug/kg/min | INTRAVENOUS | Status: DC
  Filled 2016-10-17: qty 20

## 2016-10-17 MED ORDER — SODIUM CHLORIDE 0.9 % IV BOLUS (SEPSIS)
500.0000 mL | Freq: Once | INTRAVENOUS | Status: AC
  Administered 2016-10-17: 500 mL via INTRAVENOUS

## 2016-10-17 MED ORDER — ALBUTEROL (5 MG/ML) CONTINUOUS INHALATION SOLN: 10.0000 mg/h | INHALATION_SOLUTION | Freq: Once | RESPIRATORY_TRACT | Status: DC

## 2016-10-17 MED ORDER — MIDAZOLAM HCL 2 MG/2ML IJ SOLN: 1.0000 mg | Freq: Once | INTRAMUSCULAR | Status: DC

## 2016-10-17 MED ORDER — NOREPINEPHRINE BITARTRATE 1 MG/ML IV SOLN
0.0000 ug/min | INTRAVENOUS | Status: DC
  Filled 2016-10-17: qty 4

## 2016-10-17 MED ORDER — ETOMIDATE 2 MG/ML IV SOLN
20.0000 mg | Freq: Once | INTRAVENOUS | Status: AC
  Administered 2016-10-17: 20 mg via INTRAVENOUS

## 2016-10-17 MED ORDER — DEXTROSE 5 % IV SOLN
500.0000 mg | Freq: Once | INTRAVENOUS | Status: AC
  Administered 2016-10-17: 500 mg via INTRAVENOUS
  Filled 2016-10-17: qty 500

## 2016-10-17 MED ORDER — SODIUM CHLORIDE 0.9 % IV SOLN
INTRAVENOUS | Status: DC
  Administered 2016-10-17: 08:00:00 via INTRAVENOUS

## 2016-10-17 MED ORDER — DEXTROSE 5 % IV SOLN: 500.0000 mg | Freq: Every day | INTRAVENOUS | Status: DC

## 2016-10-17 MED ORDER — METHYLPREDNISOLONE SODIUM SUCC 40 MG IJ SOLR: 40.0000 mg | Freq: Two times a day (BID) | INTRAMUSCULAR | Status: DC

## 2016-10-17 MED ORDER — ARTIFICIAL TEARS OPHTHALMIC OINT: 1.0000 "application " | TOPICAL_OINTMENT | Freq: Three times a day (TID) | OPHTHALMIC | Status: DC

## 2016-10-17 MED ORDER — DEXTROSE 5 % IV SOLN: 1.0000 g | INTRAVENOUS | Status: DC

## 2016-10-17 MED ORDER — SODIUM CHLORIDE 0.9 % IV SOLN
2.0000 mg/h | INTRAVENOUS | Status: DC
  Filled 2016-10-17: qty 10

## 2016-10-17 MED ORDER — FENTANYL CITRATE (PF) 100 MCG/2ML IJ SOLN: 50.0000 ug | Freq: Once | INTRAMUSCULAR | Status: DC

## 2016-10-17 MED ORDER — FENTANYL 2500MCG IN NS 250ML (10MCG/ML) PREMIX INFUSION: 100.0000 ug/h | INTRAVENOUS | Status: DC

## 2016-10-17 MED ORDER — IPRATROPIUM-ALBUTEROL 0.5-2.5 (3) MG/3ML IN SOLN: 3.0000 mL | Freq: Four times a day (QID) | RESPIRATORY_TRACT | Status: DC

## 2016-10-17 MED ORDER — INSULIN ASPART 100 UNIT/ML ~~LOC~~ SOLN: 2.0000 [IU] | SUBCUTANEOUS | Status: DC

## 2016-10-17 MED ORDER — CISATRACURIUM BOLUS VIA INFUSION
0.1000 mg/kg | Freq: Once | INTRAVENOUS | Status: DC
  Filled 2016-10-17: qty 11

## 2016-10-17 MED ORDER — EPINEPHRINE PF 1 MG/10ML IJ SOSY
PREFILLED_SYRINGE | INTRAMUSCULAR | Status: AC | PRN
  Administered 2016-10-17 (×2): 1 mg via INTRAVENOUS

## 2016-10-17 MED ORDER — ASPIRIN 81 MG PO CHEW: 81.0000 mg | CHEWABLE_TABLET | Freq: Every day | ORAL | Status: DC

## 2016-10-17 MED ORDER — NOREPINEPHRINE BITARTRATE 1 MG/ML IV SOLN
0.0000 ug/min | Freq: Once | INTRAVENOUS | Status: AC
  Administered 2016-10-17: 2 ug/min via INTRAVENOUS

## 2016-10-17 MED ORDER — ASPIRIN 300 MG RE SUPP: 300.0000 mg | RECTAL | Status: DC

## 2016-10-17 MED ORDER — MIDAZOLAM BOLUS VIA INFUSION
1.0000 mg | INTRAVENOUS | Status: DC | PRN
  Filled 2016-10-17: qty 1

## 2016-10-17 MED ORDER — SODIUM BICARBONATE 8.4 % IV SOLN
INTRAVENOUS | Status: AC | PRN
  Administered 2016-10-17: 100 meq via INTRAVENOUS

## 2016-10-17 MED ORDER — CISATRACURIUM BOLUS VIA INFUSION
0.0500 mg/kg | INTRAVENOUS | Status: DC | PRN
  Filled 2016-10-17: qty 6

## 2016-10-17 MED ORDER — FENTANYL BOLUS VIA INFUSION
25.0000 ug | INTRAVENOUS | Status: DC | PRN
  Filled 2016-10-17: qty 25

## 2016-10-17 MED ORDER — SODIUM BICARBONATE 8.4 % IV SOLN
INTRAVENOUS | Status: DC | PRN
  Administered 2016-10-17: 50 meq via INTRAVENOUS

## 2016-10-18 MED FILL — Medication: Qty: 1 | Status: AC

## 2016-10-19 ENCOUNTER — Ambulatory Visit: Payer: Medicare Other | Admitting: *Deleted

## 2016-10-22 LAB — CULTURE, BLOOD (ROUTINE X 2)
Culture: NO GROWTH
Culture: NO GROWTH
SPECIAL REQUESTS: ADEQUATE

## 2016-10-24 ENCOUNTER — Telehealth: Payer: Self-pay

## 2016-10-24 NOTE — Telephone Encounter (Signed)
On 10/24/2016 I received a death certificate from [facility] [employer] (original). The death certificate is for burial. The patient is a patient of Doctor Dios. The death certificate will be taken to [employer] this pm for signature.  On 11/16/2016 I received the death certificate back from Doctor Dios. I got the death certificate ready and mailed the d/c to vital records per the funeral home request.

## 2016-11-03 ENCOUNTER — Telehealth: Payer: Self-pay | Admitting: *Deleted

## 2016-11-03 NOTE — Telephone Encounter (Signed)
Per  DME patient passed away before being set-up with cpap.

## 2016-11-03 NOTE — Telephone Encounter (Signed)
-----   Message from Reesa Chew, CMA sent at 09/19/2016 11:58 AM EDT ----- Regarding: 10 week follow up after set up, todays date 09/19/16 Please send confirmation once she is set up to schedule appropriate follow-up.

## 2016-11-10 NOTE — H&P (Signed)
PULMONARY / CRITICAL CARE MEDICINE   Name: Cynthia Fuller MRN: [id number] DOB: [birth year]    ADMISSION DATE:  Oct 25, 2016 CONSULTATION DATE:  2016/10/25  REFERRING MD:  Dr. Wilkie Aye  CHIEF COMPLAINT:  PEA arrest  HISTORY OF PRESENT ILLNESS:   71 year old female with PMH as below, which is significant for COPD, OSA on CPAP, DM, RA, and CAD. She has been feeling relatively good recently with the exception that she felt her CPAP hadn't been giving her enough air over the last few nights. 5/8 Early AM she awoke from sleep with profound dyspnea which worsened until she became unresponsive. This was witnessed by family who called EMS. First responders arrived and found her unconscious with a pulse; therefore, they performed one round of CPR. Upon EMS arrival she was found to be pulseless with PEA. She underwent one additional round of CPR and ROSC was achieved. She was transported to the ED where she was emergently intubated. She became hypotensive requiring high dose norepinephrine. PCCM asked to admit.   She remained unresponsive in ED; therefore, hypothermia protocol was initiated.  PAST MEDICAL HISTORY :  She  has a past medical history of Anxiety; CAD (coronary artery disease), native coronary artery (2009); COPD (chronic obstructive pulmonary disease) (HCC); Depression; Diabetes mellitus without complication (HCC); Hypercholesteremia; Hypertension; Rheumatoid arthritis (HCC); and Sleep apnea.  PAST SURGICAL HISTORY: She  has a past surgical history that includes Ablation (2000/2002?).  Allergies  Allergen Reactions  . Neomycin Hives  . Other     Mold, garlic- gets a sore throat    No current facility-administered medications on file prior to encounter.    Current Outpatient Prescriptions on File Prior to Encounter  Medication Sig  . ALPRAZolam (XANAX) 0.5 MG tablet Take 1 tablet (0.5 mg total) by mouth 3 (three) times daily as needed for anxiety. (Patient taking differently: Take 0.5 mg by  mouth as needed for anxiety. 1 tablet QHS PRN)  . aspirin EC 81 MG tablet Take 81 mg by mouth daily.  [name] HYDROcodone-acetaminophen (NORCO/VICODIN) 5-325 MG tablet Take 1 tablet by mouth every 6 (six) hours as needed for moderate pain.  [name] ibuprofen (ADVIL,MOTRIN) 200 MG tablet Take 200 mg by mouth every 6 (six) hours as needed (pain).   . insulin aspart (NOVOLOG) 100 UNIT/ML injection Inject 0-10 Units into the skin 3 (three) times daily before meals. 100-150=5 units 151-200=8 units >=201 = 10 units (Patient taking differently: Inject as per sliding scale: if 0-149=0; 150-600=5, subcutaneously before meals)  . insulin glargine (LANTUS) 100 UNIT/ML injection Inject 35 Units into the skin at bedtime.  . Ipratropium-Albuterol (COMBIVENT IN) Inhale into the lungs.  [name] lisinopril (PRINIVIL,ZESTRIL) 10 MG tablet Take 10 mg by mouth daily.  . Multiple Vitamins-Minerals (MULTIVITAMIN WITH MINERALS) tablet Take 1 tablet by mouth daily.  . polyethylene glycol (MIRALAX) packet Take 17 g by mouth daily.    FAMILY HISTORY:  Her indicated that the status of her mother is unknown. She indicated that the status of her father is unknown. She indicated that the status of her sister is unknown. She indicated that one of her two brothers is deceased.    SOCIAL HISTORY: She  reports that she has been smoking Cigarettes.  She has been smoking about 0.30 packs per day. She quit smokeless tobacco use about a year ago. She reports that she does not drink alcohol or use drugs.  REVIEW OF SYSTEMS:  Unable to obtain as patient is encephalopathic.  SUBJECTIVE: On vent,  unresponsive.   VITAL SIGNS: BP 105/82 (BP Location: Right Arm)   Pulse (!) 117   Temp (!) 95.7 F (35.4 C) (Rectal)   Resp (!) 28   Ht 5\' 4"  (1.626 m)   Wt 102.1 kg (225 lb)   SpO2 94%   BMI 38.62 kg/m   HEMODYNAMICS:    VENTILATOR SETTINGS: Vent Mode: PRVC FiO2 (%):  [40 %-100 %] 40 % Set Rate:  [18 bmp] 18 bmp Vt Set:  [440 mL] 440  mL PEEP:  [5 cmH20] 5 cmH20 Plateau Pressure:  [27 cmH20] 27 cmH20  INTAKE / OUTPUT: I/O last 3 completed shifts: In: 2300 [I.V.:2300] Out: -   PHYSICAL EXAMINATION: General:  Morbidly obese female on vent Neuro:  Comatose (no sedation except intubation meds 1.5 hours prior to my evaluation) HEENT:  [place]/AT, PERRL, no appreciable JVD Cardiovascular:  Tachy, regular, no MRG Lungs: Expiratory wheeze Abdomen:  Protuberant, soft Musculoskeletal:  No acute deformity Skin:  Grossly intact  LABS:  BMET  Recent Labs Lab 10-22-16 0512  NA 144  K 4.9  CL 109  CO2 23  BUN 10  CREATININE 1.17*  GLUCOSE 293*    Electrolytes  Recent Labs Lab 22-Oct-2016 0512  CALCIUM 8.9    CBC  Recent Labs Lab 10/22/2016 0512  WBC 5.2  HGB 11.0*  HCT 35.5*  PLT 192    Coag's No results for input(s): APTT, INR in the last 168 hours.  Sepsis Markers  Recent Labs Lab Oct 22, 2016 0521  LATICACIDVEN 7.09*    ABG  Recent Labs Lab 22-Oct-2016 0536  PHART 7.057*  PCO2ART 76.9*  PO2ART 379.0*    Liver Enzymes  Recent Labs Lab 10/22/16 0512  AST 622*  ALT 650*  ALKPHOS 71  BILITOT 0.5  ALBUMIN 2.9*    Cardiac Enzymes No results for input(s): TROPONINI, PROBNP in the last 168 hours.  Glucose No results for input(s): GLUCAP in the last 168 hours.  Imaging Dg Chest Portable 1 View  Result Date: Oct 22, 2016 CLINICAL DATA:  71 y/o F; post CPR, endotracheal tube, orogastric tube. EXAM: PORTABLE CHEST 1 VIEW COMPARISON:  10/29/2015 chest radiograph. FINDINGS: Normal cardiac silhouette given projection and technique. Endotracheal tube is 13 mm from carina. Enteric tube tip extends below the field of view into the abdomen. Interstitial prominence probably represents interstitial edema. No focal consolidation. No effusion or pneumothorax. No acute osseous abnormality is evident. IMPRESSION: Enteric tube tip 13 mm from carina, consider 2-3 cm retraction. Enteric tube tip below the field  of view in the abdomen. Interstitial pulmonary edema. Electronically Signed   By: Mitzi Hansen M.D.   On: Oct 22, 2016 05:28     STUDIES:  CXR 5/8 > mild interstitial edema. CT head 5/8 >  Echo 5/8 >   CULTURES: Blood 5/8 > Sputum 5/8 >  ANTIBIOTICS: Ceftriaxone 5/8 >   SIGNIFICANT EVENTS: 5/8 > admit, started on hypothermia protocol.  LINES/TUBES: ETT 5/8 >  CVL pending 5/8 > A line pending 5/8 >   DISCUSSION: 71 year old female with history of COPD and OSA on CPAP, admitted 5/8 with PEA arrest. She awoke from sleep earlier that morning with profound dyspnea and upon EMS arrival, was found to be in PEA. Suspect CPAP malfunction with resultant hypoxemia leading to arrest (she felt that CPAP had not been given her enough air over the past few days)  ASSESSMENT / PLAN:  PULMONARY A: Acute hypoxemic/hypercarbic respiratory failure - S/P intubation in ED. COPD with acute exacerbation. OSA on CPAP -  reported that CPAP had not been given her enough air over the past few days. P:   Continue full vent support. Assess ABG, wean as able. Hold SBT until TTM complete. Empiric ceftriaxone. Continue steroids, BD's. Follow CXR.  CARDIOVASCULAR A:  Cardiac Arrest PEA - suspect CPAP malfunction with resultant hypoxemia leading to arrest. Cardiogenic shock - echo from February 2018 with EF 55-60%, G1DD. Hx HTN, HLD, CAD. P:  Start TTM, goal 33. Levophed as needed for goal MAP > 80. Trend troponin, lactate. Assess echo. Repeat EKG. Cardiology consult. Continue preadmission ASA. Hold preadmission lisinopril.  RENAL A:   AKI - exacerbated by ACEi. At risk for multiple metabolic derangements during TTM. P:   NS @ 167ml/hr. Correct electrolytes as indicated. BMP every 2 hours  4 then every 4 hours.  GASTROINTESTINAL A:   Transaminitis - ? Shock liver.  Although, had short downtime; therefore less likely. GI prophylaxis. Nutrition. P:   Trend LFTs. SUP:  Pantoprazole. NPO for now. Start tube feeds once TTM complete.  HEMATOLOGIC A:   VTE prophylaxis. P:  SCD's / heparin. Coags q8hrs x 2. CBC and coags in AM.  INFECTIOUS A:   AECOPD. P:   Ceftriaxone x 5 days. Follow cultures.  ENDOCRINE A:   Hx DM. P:   ICU hyperglycemia protocol.  NEUROLOGIC A:   Acute anoxic vs metabolic encephalopathy. Hx anxiety. P:   Sedation: Cisatracurium gtt / Fentanyl gtt / Midazolam gtt. RASS goal: -5 during cooling phase of TTM. Hold WUA until off paralytics. Assess EEG, UDS. F/u on CT head. May need neuro consult depending on clinical course. Hold preadmission alprazolam, norco.   FAMILY  - Updates: Daughter updated at bedside by Joneen Roach.  Full code status per daughter.  - Inter-disciplinary family meet or Palliative Care meeting due by:  5/14.  CC time: 40 min.   Rutherford Guys, [place] - [facility] Pager: [phone]  or [phone] Oct 20, 2016, 7:40 AM    ATTENDING NOTE / ATTESTATION NOTE :   I have discussed the case with the resident/APP  Rutherford Guys PA and Joneen Roach NP  I agree with the resident/APP's  history, physical examination, assessment, and plans.    I have edited the above note and modified it according to our agreed history, physical examination, assessment and plan.   Briefly, 71 year old female with PMH  significant for COPD, OSA on CPAP, DM, RA, and CAD. She has been feeling relatively good recently with the exception that she felt her CPAP hadn't been giving her enough air over the last few nights. 5/8 Early AM she awoke from sleep with profound dyspnea which worsened until she became unresponsive. This was witnessed by family who called EMS. First responders arrived and found her unconscious with a pulse; therefore, they performed one round of CPR. Upon EMS arrival she was found to be pulseless with PEA. She underwent one additional round of CPR and ROSC was  achieved. She was transported to the ED where she was emergently intubated. She became hypotensive requiring high dose norepinephrine. PCCM asked to admit.  She remained unresponsive in ED; therefore, hypothermia protocol was initiated.  Her BP subsequently bottomed out despite titrating levophed up.  She had sinus bradycardia then went into arrest.  She ended up having 14 minutes of CPR before she had ROSC.  She got several epi pushes + 2 amps bicarbonate.  She was also shocked for vfib.  She went into cardiac arrest again and was pronounced after 4 more minutes of arrest.  Total arrest time was at least 25 minutes.   Family has been updated of events since they have beein in the ED throughout this time.  In between arrests, pt was not doing anything.  No response despite not being on sedation.    Vitals:  Vitals:   2016-11-14 0811 Nov 14, 2016 0814 2016-11-14 0820 Nov 14, 2016 0856  BP: (!) 78/42 107/74 93/78   Pulse: (!) 124  (!) 30   Resp: (!) 26 19 (!) 30   Temp:      TempSrc:      SpO2:   (!) 79%   Weight:    102.1 kg (225 lb)  Height:        Constitutional/General: well-nourished, well-developed, intubated, sedated, not in any distress  Body mass index is 38.62 kg/m. Wt Readings from Last 3 Encounters:  11-14-16 102.1 kg (225 lb)  09/04/16 86.2 kg (190 lb)  07/04/16 87 kg (191 lb 12.8 oz)    HEENT: pupils were non reactive. anicteric sclerae. (-) Oral thrush. Intubated, ETT in place  Neck: No masses. Midline trachea. No JVD, (-) LAD. (-) bruits appreciated.  Respiratory/Chest: Grossly normal chest. (-) deformity. (-) Accessory muscle use.  Symmetric expansion. Diminished BS on both lower lung zones. (-) wheezing,  Rhonchi Crackles at bases (-) egophony  Cardiovascular: Regular rate and  rhythm, heart sounds normal, no murmur or gallops,  Trace peripheral edema  Gastrointestinal:  Hypoactive bowel sounds. Soft, non-tender. No hepatosplenomegaly.  (-) masses.   Musculoskeletal:   Unable to determine  Extremities: Grossly normal. (-) clubbing, cyanosis.  Trace  Edema. Cool distal extremities  Skin: (-) rash,lesions seen.   Neurological/Psychiatric : sedated, intubated. CN grossly intact. (-) lateralizing signs.     CBC Recent Labs     2016/11/14  0512  WBC  5.2  HGB  11.0*  HCT  35.5*  PLT  192    Coag's No results for input(s): APTT, INR in the last 72 hours.  BMET Recent Labs     11/14/2016  0512  NA  144  K  4.9  CL  109  CO2  23  BUN  10  CREATININE  1.17*  GLUCOSE  293*    Electrolytes Recent Labs     11/14/2016  0512  CALCIUM  8.9    Sepsis Markers No results for input(s): PROCALCITON, O2SATVEN in the last 72 hours.  Invalid input(s): LACTICACIDVEN  ABG Recent Labs     Nov 14, 2016  0536  PHART  7.057*  PCO2ART  76.9*  PO2ART  379.0*    Liver Enzymes Recent Labs     2016-11-14  0512  AST  622*  ALT  650*  ALKPHOS  71  BILITOT  0.5  ALBUMIN  2.9*    Cardiac Enzymes No results for input(s): TROPONINI, PROBNP in the last 72 hours.  Glucose No results for input(s): GLUCAP in the last 72 hours.  Imaging Dg Chest Portable 1 View  Result Date: 2016/11/14 CLINICAL DATA:  71 y/o F; post CPR, endotracheal tube, orogastric tube. EXAM: PORTABLE CHEST 1 VIEW COMPARISON:  10/29/2015 chest radiograph. FINDINGS: Normal cardiac silhouette given projection and technique. Endotracheal tube is 13 mm from carina. Enteric tube tip extends below the field of view into the abdomen. Interstitial prominence probably represents interstitial edema. No focal consolidation. No effusion or pneumothorax. No acute osseous abnormality is evident. IMPRESSION: Enteric tube tip 13 mm from carina,  consider 2-3 cm retraction. Enteric tube tip below the field of view in the abdomen. Interstitial pulmonary edema. Electronically Signed   By: Mitzi Hansen M.D.   On: 30-Oct-2016 05:28    Assessment: S/P Cardiac arrest, likely from ACS + acute  pulmonary edema. 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  Patient received 3.5 L saline as part of resuscitation.  She was on levophed drip.  She was placed on broad spectrum abx.  She was pancultured. She was on the ventilator.  The plan was to transfer her to ICU but she had 2 episodes of cardiac arrest.  The second arrest ended up being the last as she was subsequently pronounced.  Family around when pt was pronounced.     I spent  60   minutes of Critical Care time with this patient today. This is my time spent independent of the APP or resident.   Family :Family updated extensively  at length today.   Pollie Meyer, MD Oct 30, 2016, 10:52 AM [facility] Pager [phone] After 3 pm or if no answer, call [phone]

## 2016-11-10 NOTE — Progress Notes (Signed)
ETT retracted 2cm per xray and MD. Placement was 23 now 21 measured from the lip

## 2016-11-10 NOTE — ED Provider Notes (Addendum)
[facility]-EMERGENCY DEPT Provider Note   CSN: [id number] Arrival date & time:        History   Chief Complaint Chief Complaint  Patient presents with  . Shortness of Breath    Post CPR    HPI Cynthia Fuller is a 71 y.o. female.  HPI  Level V caveat secondary to acuity of condition.  Per EMS report, they were called out for shortness of breath. Upon arrival from firefighters, patient was found to be unconscious and without a pulse. Patient received 1 round of CPR by initial first responders. Upon EMS arrival, she was in PE a arrest and received 1 additional round of CPR with return of spontaneous circulation. Since that time she is not had any meaningful response but has been breathing somewhat on her own. Airway was unable to be established because of clenching of her jaw. She has been tachycardic. Blood pressure for EMS 130 systolic.  Upon arrival the patient's daughter, she reports that her mother over the last 2-3 nights has had worsening shortness of breath. She wears CPAP at night. However, the daughter checked on her at 11:30 PM and she was resting comfortably. No recent illnesses. She does have a history of asthma and allergies.  Past Medical History:  Diagnosis Date  . Anxiety   . CAD (coronary artery disease), native coronary artery 2009   cath at [facility] 2009 with 30% distal LM, 50-60% heavily calcified mid RCA on medical management,  . COPD (chronic obstructive pulmonary disease) (HCC)   . Depression   . Diabetes mellitus without complication (HCC)   . Hypercholesteremia   . Hypertension   . Rheumatoid arthritis (HCC)   . Sleep apnea    intolerant to CPAP    Patient Active Problem List   Diagnosis Date Noted  . Cardiac arrest (HCC) 10-24-2016  . Heart palpitations 07/04/2016  . Anxiety 06/19/2016  . COPD (chronic obstructive pulmonary disease) (HCC) 06/19/2016  . Depression 06/19/2016  . Anxiety disorder 11/22/2015  . 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 Tremor 11/22/2015  . Pancreatitis 11/02/2015  . Acute pancreatitis 11/02/2015  . Elevated lipase 11/02/2015  . RUQ pain   . Constipation due to slow transit 11/01/2015  . Type 2 diabetes mellitus without complication (HCC) 10/26/2015  . Smokes   . Abnormality of gait   . Leg weakness   . Lower extremity weakness 10/21/2015  . 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 Essential hypertension 10/21/2015  . OSA (obstructive sleep apnea) 10/21/2015  . Hyperlipidemia, unspecified 10/21/2015  . Rheumatoid arthritis (HCC) 10/21/2015  . Sore throat 10/05/2015  . Tobacco dependence 06/30/2014  . CAD (coronary artery disease), native coronary artery 06/13/2007    Past Surgical History:  Procedure Laterality Date  . ABLATION  2000/2002?    OB History    No data available       Home Medications    Prior to Admission medications   Medication Sig Start Date End Date Taking? Authorizing Provider  ALPRAZolam Prudy Feeler) 0.5 MG tablet Take 1 tablet (0.5 mg total) by mouth 3 (three) times daily as needed for anxiety. Patient taking differently: Take 0.5 mg by mouth as needed for anxiety. 1 tablet QHS PRN 10/29/15   Rolland Porter, MD  aspirin EC 81 MG tablet Take 81 mg by mouth daily.    [provider]  HYDROcodone-acetaminophen (NORCO/VICODIN) 5-325 MG tablet Take 1 tablet by mouth every 6 (six) hours as needed for moderate pain.  [provider]  ibuprofen (ADVIL,MOTRIN) 200 MG tablet Take 200 mg by mouth every 6 (six) hours as needed (pain).     [provider]  insulin aspart (NOVOLOG) 100 UNIT/ML injection Inject 0-10 Units into the skin 3 (three) times daily before meals. 100-150=5 units 151-200=8 units >=201 = 10 units Patient taking differently: Inject as per sliding scale: if 0-149=0; 150-600=5, subcutaneously before meals 10/26/15   [name], NP  insulin glargine (LANTUS) 100 UNIT/ML injection Inject 35 Units into  the skin at bedtime.    [provider]  Ipratropium-Albuterol (COMBIVENT IN) Inhale into the lungs.    [provider]  lisinopril (PRINIVIL,ZESTRIL) 10 MG tablet Take 10 mg by mouth daily.    [provider]  Multiple Vitamins-Minerals (MULTIVITAMIN WITH MINERALS) tablet Take 1 tablet by mouth daily.    [provider]  polyethylene glycol (MIRALAX) packet Take 17 g by mouth daily. 10/29/15   Eyvonne Mechanic, PA-C    Family History Family History  Problem Relation Age of Onset  . Diabetes Brother   . Kidney disease Brother     RENAL TRANSPARENT  . Diabetes Mother   . Hearing loss Mother   . Hypertension Mother   . Osteoporosis Mother   . Pancreatic cancer Father   . Hypertension Father   . Alcohol abuse Sister   . Drug abuse Sister     Social History Social History  Substance Use Topics  . Smoking status: Smoker, Current Status Unknown    Packs/day: 0.30    Types: Cigarettes  . Smokeless tobacco: Former Neurosurgeon    Quit date: 10/20/2015     Comment: 1 pack every 3 days  . Alcohol use No     Allergies   Neomycin and Other   Review of Systems Review of Systems  Unable to perform ROS: Acuity of condition     Physical Exam Updated Vital Signs BP 107/71   Pulse (!) 118   Temp (!) 95.7 F (35.4 C) (Rectal)   Resp (!) 21   Ht 5\' 4"  (1.626 m)   Wt 225 lb (102.1 kg)   SpO2 93%   BMI 38.62 kg/m   Physical Exam  Constitutional:  Ill-appearing, obese, minimally responsive  HENT:  Head: Normocephalic and atraumatic.  Eyes: Pupils are equal, round, and reactive to light.  Pupils 4 mm reactive bilaterally  Cardiovascular: Regular rhythm and normal heart sounds.   Tachycardia  Pulmonary/Chest: No respiratory distress. She has wheezes.  Apneic breathing, distant breath sounds  Abdominal: Soft. Bowel sounds are normal. She exhibits no distension.  Musculoskeletal:  Trace bilateral lower extremity edema  Neurological: GCS eye  subscore is 1. GCS verbal subscore is 1. GCS motor subscore is 4.  Skin: Skin is warm and dry.  Psychiatric: She has a normal mood and affect.  Nursing note and vitals reviewed.    ED Treatments / Results  Labs (all labs ordered are listed, but only abnormal results are displayed) Labs Reviewed  CBC WITH DIFFERENTIAL/PLATELET - Abnormal; Notable for the following:       Result Value   RBC 3.85 (*)    Hemoglobin 11.0 (*)    HCT 35.5 (*)    RDW 16.8 (*)    Neutro Abs 1.4 (*)    All other components within normal limits  COMPREHENSIVE METABOLIC PANEL - Abnormal; Notable for the following:    Glucose, Bld 293 (*)    Creatinine, Ser 1.17 (*)    Albumin 2.9 (*)  AST 622 (*)    ALT 650 (*)    GFR calc non Af Amer 46 (*)    GFR calc Af Amer 53 (*)    All other components within normal limits  BRAIN NATRIURETIC PEPTIDE - Abnormal; Notable for the following:    B Natriuretic Peptide 297.0 (*)    All other components within normal limits  I-STAT CG4 LACTIC ACID, ED - Abnormal; Notable for the following:    Lactic Acid, Venous 7.09 (*)    All other components within normal limits  I-STAT ARTERIAL BLOOD GAS, ED - Abnormal; Notable for the following:    pH, Arterial 7.057 (*)    pCO2 arterial 76.9 (*)    pO2, Arterial 379.0 (*)    Acid-base deficit 9.0 (*)    All other components within normal limits  CULTURE, BLOOD (ROUTINE X 2)  CULTURE, BLOOD (ROUTINE X 2)  CULTURE, RESPIRATORY (NON-EXPECTORATED)  URINALYSIS, ROUTINE W REFLEX MICROSCOPIC  BASIC METABOLIC PANEL  BASIC METABOLIC PANEL  BASIC METABOLIC PANEL  BASIC METABOLIC PANEL  PROTIME-INR  PROTIME-INR  APTT  APTT  BLOOD GAS, ARTERIAL  BLOOD GAS, ARTERIAL  TROPONIN I  TROPONIN I  LACTIC ACID, PLASMA  LACTIC ACID, PLASMA  RAPID URINE DRUG SCREEN, HOSP PERFORMED  I-STAT TROPOININ, ED  I-STAT ARTERIAL BLOOD GAS, ED  I-STAT CG4 LACTIC ACID, ED    EKG  EKG Interpretation  Date/Time:  Tuesday Nov 12, 2016 05:04:02  EDT Ventricular Rate:  130 PR Interval:    QRS Duration: 127 QT Interval:  332 QTC Calculation: 489 R Axis:   -109 Text Interpretation:  Sinus tachycardia RBBB and LAFB ST elevation, consider inferior injury Confirmed by Cynthia Fuller ([id number]) on 11/12/16 5:50:32 AM Also confirmed by Cynthia Fuller [id number]), editor Elita Quick ([id number])  on November 12, 2016 7:09:35 AM       Radiology Dg Chest Portable 1 View  Result Date: November 12, 2016 CLINICAL DATA:  71 y/o F; post CPR, endotracheal tube, orogastric tube. EXAM: PORTABLE CHEST 1 VIEW COMPARISON:  10/29/2015 chest radiograph. FINDINGS: Normal cardiac silhouette given projection and technique. Endotracheal tube is 13 mm from carina. Enteric tube tip extends below the field of view into the abdomen. Interstitial prominence probably represents interstitial edema. No focal consolidation. No effusion or pneumothorax. No acute osseous abnormality is evident. IMPRESSION: Enteric tube tip 13 mm from carina, consider 2-3 cm retraction. Enteric tube tip below the field of view in the abdomen. Interstitial pulmonary edema. Electronically Signed   By: Mitzi Hansen M.D.   On: 2016-11-12 05:28    Procedures Procedures (including critical care time)  CRITICAL CARE Performed by: Shon Baton   Total critical care time: 75 minutes  Critical care time was exclusive of separately billable procedures and treating other patients.  Critical care was necessary to treat or prevent imminent or life-threatening deterioration.  Critical care was time spent personally by me on the following activities: development of treatment plan with patient and/or surrogate as well as nursing, discussions with consultants, evaluation of patient's response to treatment, examination of patient, obtaining history from patient or surrogate, ordering and performing treatments and interventions, ordering and review of laboratory studies, ordering and review of  radiographic studies, pulse oximetry and re-evaluation of patient's condition.  INTUBATION Performed by: Cynthia Fuller F  Required items: required blood products, implants, devices, and special equipment available Patient identity confirmed: provided demographic data and hospital-assigned identification number Time out: Immediately prior to procedure a "time out" was called to verify the correct  patient, procedure, equipment, support staff and site/side marked as required.  Indications: Respiratory failure; arrest  Intubation method: Glidescope Laryngoscopy   Preoxygenation: BVM  Sedatives: Etomidate Paralytic: Rocuronium  Tube Size: 7.5 cuffed  Post-procedure assessment: chest rise and ETCO2 monitor Breath sounds: equal and absent over the epigastrium Tube secured with: ETT holder Chest x-ray interpreted by radiologist and me.  Chest x-ray findings: endotracheal tube  needs to be pulled back 2-3 cm.   Patient tolerated the procedure well with no immediate complications.   Cardiopulmonary Resuscitation (CPR) Procedure Note Directed/Performed by: Shon Baton I personally directed ancillary staff and/or performed CPR in an effort to regain return of spontaneous circulation and to maintain cardiac, neuro and systemic perfusion.     Medications Ordered in ED Medications  azithromycin (ZITHROMAX) 500 mg in dextrose 5 % 250 mL IVPB (not administered)  cefTRIAXone (ROCEPHIN) 1 g in dextrose 5 % 50 mL IVPB (not administered)  albuterol (PROVENTIL) (2.5 MG/3ML) 0.083% nebulizer solution 2.5 mg (2.5 mg Nebulization Given 10-25-16 0552)  norepinephrine (LEVOPHED) 4 mg in dextrose 5 % 250 mL (0.016 mg/mL) infusion (not administered)  aspirin suppository 300 mg (not administered)  cisatracurium (NIMBEX) bolus via infusion 10.2 mg (not administered)    And  cisatracurium (NIMBEX) 200 mg in sodium chloride 0.9 % 200 mL (1 mg/mL) infusion (not administered)    And    cisatracurium (NIMBEX) bolus via infusion 5.1 mg (not administered)  heparin injection 5,000 Units (not administered)  0.9 %  sodium chloride infusion ( Intravenous New Bag/Given 2016-10-25 0737)  fentaNYL (SUBLIMAZE) injection 50 mcg (not administered)  fentaNYL in NS (47mcg/ml) infusion-PREMIX (not administered)  fentaNYL (SUBLIMAZE) bolus via infusion 25 mcg (not administered)  midazolam (VERSED) injection 1 mg (not administered)  midazolam (VERSED) 50 mg in sodium chloride 0.9 % 50 mL (1 mg/mL) infusion (not administered)  midazolam (VERSED) bolus via infusion 1 mg (not administered)  methylPREDNISolone sodium succinate (SOLU-MEDROL) 40 mg/mL injection 40 mg (not administered)  aspirin chewable tablet 81 mg (not administered)  ipratropium-albuterol (DUONEB) 0.5-2.5 (3) MG/3ML nebulizer solution 3 mL (not administered)  insulin aspart (novoLOG) injection 2-6 Units (not administered)  etomidate (AMIDATE) injection 20 mg (20 mg Intravenous Given Oct 25, 2016 0508)  rocuronium (ZEMURON) injection 100 mg (100 mg Intravenous Given 2016-10-25 0508)  sodium chloride 0.9 % bolus 1,000 mL (0 mLs Intravenous Stopped 2016-10-25 0630)    And  sodium chloride 0.9 % bolus 1,000 mL (0 mLs Intravenous Stopped 2016/10/25 0630)    And  sodium chloride 0.9 % bolus 1,000 mL (1,000 mLs Intravenous New Bag/Given 10/25/2016 0631)    And  sodium chloride 0.9 % bolus 500 mL (0 mLs Intravenous Stopped 10-25-16 0704)  cefTRIAXone (ROCEPHIN) 1 g in dextrose 5 % 50 mL IVPB (0 g Intravenous Stopped 10/25/2016 0608)  azithromycin (ZITHROMAX) 500 mg in dextrose 5 % 250 mL IVPB (0 mg Intravenous Stopped 10-25-16 1610)  norepinephrine (LEVOPHED) 4 mg in dextrose 5 % 250 mL (0.016 mg/mL) infusion (34 mcg/min Intravenous Rate/Dose Change Oct 25, 2016 0749)  EPINEPHrine (ADRENALIN) 1 MG/10ML injection (1 mg Intravenous Given 10/25/2016 0808)  sodium bicarbonate injection (100 mEq Intravenous Given 10-25-2016 0759)     Initial Impression / Assessment  and Plan / ED Course  I have reviewed the triage vital signs and the nursing notes.  Pertinent labs & imaging results that were available during my care of the patient were reviewed by me and considered in my medical decision making (see chart for details).  Clinical Course as of Oct 18 838  Tue 2016-11-09  0347 Patient name now hypotensive. We will had started through peripheral IV. Discussed with critical care. They will send someone down.   [CH]    Clinical Course User Index [CH] Jasman Pfeifle, Mayer Masker, MD    Patient presents after a presumed respiratory arrest.  She is minimally responsive on arrival. Apneic respirations. PEA arrest reported. However she did not receive any drugs. She was intubated upon arrival. Lab work was obtained. Lactate is 7. Sepsis workup initiated. Given that she is intubated she was given 30 mL/kg fluid. She was given Rocephin and azithromycin to cover for respiratory sources. Unfortunately she became hypotensive. Levophed was started through peripheral IV. Critical care was consulted. Critical care PA is at the bedside.  He will place a central line for better access. Patient has not received any sedation medication since intubation. Neurologic exam remains poor off sedation, suggesting poor prognosis. CT scan was added to her workup.  8:10 AM I was called back to the bedside at the request of critical care as the patient is currently arresting. CPR was initiated.  Daughter was updated. Patient has a very poor overall prognosis.  Appears to be a PA arrest. However she did have episodes of this asystole. ACLS protocol was followed. She had good breath sounds and low suspicion at this time for pneumothorax. After 15 minutes of CPR, patient did have return of spontaneous circulation. Daughter continues to insist patient be full code and to "do everything." Critical care is at the bedside.  It was reiterated to the daughter that the patient would likely not have a meaningful  outcome given her neurologic status.  8:41 AM Patient had a second arrest. She continues to PEA.  Critical care is at the bedside and is running the code. Patient was pronounced after several rounds of CPR.    Final Clinical Impressions(s) / ED Diagnoses   Final diagnoses:  Acute on chronic respiratory failure, unspecified whether with hypoxia or hypercapnia (HCC)  Respiratory arrest (HCC)  Lactic acidosis    New Prescriptions New Prescriptions   No medications on file     Shon Baton, MD 09-Nov-2016 [id number]    Shon Baton, MD Nov 09, 2016 [id number]    Shon Baton, MD Nov 09, 2016 [phone]

## 2016-11-10 NOTE — ED Notes (Addendum)
Dr. Wilkie Aye explained admission and plan of care to pt.'s family.

## 2016-11-10 NOTE — Progress Notes (Signed)
Responded to referral to conunue support to family at bedside. Patient deceased. Facilitated information sharing. Supported Haematologist.

## 2016-11-10 NOTE — Progress Notes (Signed)
PCCM Interval Progress Note  Called emergently back to bedside due to recurrent arrest.  Per RN, HR began to lower to the point where pt lost pulses.  BP in 70's despite 62mcg/min levophed.   Code blue / ACLS protocol initiated.  Several rounds of CPR and epinephrine while Dr. Wilkie Aye from ED and Dr. Christene Slates discussed situation with family. Pt's daughter was adamant for full code and informed Dr. Wilkie Aye that a family member who had a stroke and had been on the vent for a prolonged period of time ended up making a full recovery; therefore, she remained optimistic.  ROSC achieved after ~ 8-10 minutes.  Family then came to bedside.  A few minutes later, HR began to drop again (levophed at / min with SBP in 70's, asked RN to increased to 11mcg/ min).  HR dropped further and once again, pulses lost.  Family called back to bedside immediately so that they could witness our efforts at resuscitation.  ACLS resumed, 3 rounds CPR and 2 rounds epinephrine; however, pt unfortunately did not respond.  Given multiple episodes of recurrent arrest and extremely grave prognosis, discussed with Dr. Wilkie Aye who agreed that continued efforts were futile at this point.  Time of death 44.  Family including daughter and pt's brothers at bedside; emotional support offered.  Of note, since initial RSI medications, pt had not received any sedation.  Despite this, she remained fully unresponsive throughout the events mentioned above.  Additional CC time: 30 minutes.   Rutherford Guys, [place] - C  Pulmonary & Critical Care Medicine Pgr: [phone]  or [phone] 10-24-2016, 9:00 AM

## 2016-11-10 NOTE — ED Triage Notes (Addendum)
Pt. arrived with EMS from home reports sudden onset SOB this morning and became unresponsive, firemen gave 1 round of CPR ( PEA) , she regained pulses and respirations at EMS arrival  , CBG = 153 , I/O at right lower leg /20g. left ac. Tachycardic at arrival .

## 2016-11-10 NOTE — ED Notes (Signed)
Dr. Wilkie Aye notified on pt.'s hypotension 70/50 - manual.

## 2016-11-10 NOTE — Progress Notes (Signed)
Pharmacy Antibiotic Note  Cynthia Fuller is a 71 y.o. female admitted on 11/10/2016 with CAP.  Pharmacy has been consulted for Azithromycin and Ceftriaxone dosing.  Plan: Rocephin 1gm IV q24h Azithromycin 500mg  IV q24h Pharmacy will sign off - please reconsult if needed  Height: 5\' 4"  (162.6 cm) Weight: 225 lb (102.1 kg) IBW/kg (Calculated) : 54.7  Temp (24hrs), Avg:97.2 F (36.2 C), Min:97.2 F (36.2 C), Max:97.2 F (36.2 C)   Recent Labs Lab 2016-11-10 0512 11/10/16 0521  WBC 5.2  --   LATICACIDVEN  --  7.09*    CrCl cannot be calculated (Patient's most recent lab result is older than the maximum 21 days allowed.).    Allergies  Allergen Reactions  . Neomycin Hives  . Other     Mold, garlic- gets a sore throat     Thank you for allowing pharmacy to be a part of this patient's care.  12/17/16, PharmD, BCPS Clinical pharmacist, pager [phone] 2016/11/10 5:33 AM

## 2016-11-10 NOTE — ED Notes (Signed)
[facility] made aware of pt. referal # [id number] spoke with Luisa Hart

## 2016-11-10 NOTE — ED Notes (Signed)
Artic sun pads placed on PT

## 2016-11-10 NOTE — ED Notes (Addendum)
Pt. intubated by Dr. Wilkie Aye at arrival .

## 2016-11-10 NOTE — Progress Notes (Signed)
Pt was intubated when RT began her shift. The Pt was unstable and she went  asystole 3 times and CPR was performed. The doctor explained to the family that we had exhausted all attempts to revive their mom. The family agreed to stop CPR.

## 2016-11-10 NOTE — ED Notes (Addendum)
Henreitta Leber NP at bedside evaluating pt. , explained pt.'s condition , admission and plan of care to pt.

## 2016-11-10 NOTE — Progress Notes (Signed)
Chaplain was paged to offer support to family of Pt that lost pulse. Pt regain pulse . Chaplain escorted daughter consultation room. Dght kneeled and prayed for mother. Other family came and Chaplain helped with refreshments. Chaplain escorted family back 2 by 2 until no one wanted to come. Pt brother is a Education officer, environmental and took charge of the spiritual support.   Oct 19, 2016 0800  Clinical Encounter Type  Visited With Family;Patient and family together  Visit Type Initial;Spiritual support;ED  Referral From Nurse  Spiritual Encounters  Spiritual Needs Prayer;Emotional  Stress Factors  Patient Stress Factors Major life changes  Family Stress Factors Major life changes

## 2016-11-10 NOTE — ED Notes (Signed)
Critical care MD at bedside speaking with family.

## 2016-11-10 NOTE — Discharge Summary (Signed)
PULMONARY / CRITICAL CARE MEDICINE   Name: Cynthia Fuller MRN: [id number] DOB: [birth year]    ADMISSION DATE:  Oct 25, 2016 CONSULTATION DATE:  2016/10/25  REFERRING MD:  Dr. Wilkie Aye  CHIEF COMPLAINT:  PEA arrest  HISTORY OF PRESENT ILLNESS:   71 year old female with PMH as below, which is significant for COPD, OSA on CPAP, DM, RA, and CAD. She has been feeling relatively good recently with the exception that she felt her CPAP hadn't been giving her enough air over the last few nights. 5/8 Early AM she awoke from sleep with profound dyspnea which worsened until she became unresponsive. This was witnessed by family who called EMS. First responders arrived and found her unconscious with a pulse; therefore, they performed one round of CPR. Upon EMS arrival she was found to be pulseless with PEA. She underwent one additional round of CPR and ROSC was achieved. She was transported to the ED where she was emergently intubated. She became hypotensive requiring high dose norepinephrine. PCCM asked to admit.   She remained unresponsive in ED; therefore, hypothermia protocol was initiated.  PAST MEDICAL HISTORY :  She  has a past medical history of Anxiety; CAD (coronary artery disease), native coronary artery (2009); COPD (chronic obstructive pulmonary disease) (HCC); Depression; Diabetes mellitus without complication (HCC); Hypercholesteremia; Hypertension; Rheumatoid arthritis (HCC); and Sleep apnea.  PAST SURGICAL HISTORY: She  has a past surgical history that includes Ablation (2000/2002?).  Allergies  Allergen Reactions  . Neomycin Hives  . Other     Mold, garlic- gets a sore throat    No current facility-administered medications on file prior to encounter.    Current Outpatient Prescriptions on File Prior to Encounter  Medication Sig  . ALPRAZolam (XANAX) 0.5 MG tablet Take 1 tablet (0.5 mg total) by mouth 3 (three) times daily as needed for anxiety. (Patient taking differently: Take 0.5 mg by  mouth as needed for anxiety. 1 tablet QHS PRN)  . aspirin EC 81 MG tablet Take 81 mg by mouth daily.  [name] HYDROcodone-acetaminophen (NORCO/VICODIN) 5-325 MG tablet Take 1 tablet by mouth every 6 (six) hours as needed for moderate pain.  [name] ibuprofen (ADVIL,MOTRIN) 200 MG tablet Take 200 mg by mouth every 6 (six) hours as needed (pain).   . insulin aspart (NOVOLOG) 100 UNIT/ML injection Inject 0-10 Units into the skin 3 (three) times daily before meals. 100-150=5 units 151-200=8 units >=201 = 10 units (Patient taking differently: Inject as per sliding scale: if 0-149=0; 150-600=5, subcutaneously before meals)  . insulin glargine (LANTUS) 100 UNIT/ML injection Inject 35 Units into the skin at bedtime.  . Ipratropium-Albuterol (COMBIVENT IN) Inhale into the lungs.  [name] lisinopril (PRINIVIL,ZESTRIL) 10 MG tablet Take 10 mg by mouth daily.  . Multiple Vitamins-Minerals (MULTIVITAMIN WITH MINERALS) tablet Take 1 tablet by mouth daily.  . polyethylene glycol (MIRALAX) packet Take 17 g by mouth daily.    FAMILY HISTORY:  Her indicated that the status of her mother is unknown. She indicated that the status of her father is unknown. She indicated that the status of her sister is unknown. She indicated that one of her two brothers is deceased.    SOCIAL HISTORY: She  reports that she has been smoking Cigarettes.  She has been smoking about 0.30 packs per day. She quit smokeless tobacco use about a year ago. She reports that she does not drink alcohol or use drugs.  REVIEW OF SYSTEMS:  Unable to obtain as patient is encephalopathic.  SUBJECTIVE: On vent,  unresponsive.   VITAL SIGNS: BP 93/78   Pulse (!) 30   Temp (!) 95.7 F (35.4 C) (Rectal)   Resp (!) 30   Ht 5\' 4"  (1.626 m)   Wt 102.1 kg (225 lb)   SpO2 (!) 79%   BMI 38.62 kg/m   HEMODYNAMICS:    VENTILATOR SETTINGS: Vent Mode: PRVC FiO2 (%):  [40 %-100 %] 40 % Set Rate:  [18 bmp-30 bmp] 30 bmp Vt Set:  [440 mL] 440 mL PEEP:  [5  cmH20] 5 cmH20 Plateau Pressure:  [27 cmH20-31 cmH20] 31 cmH20  INTAKE / OUTPUT: I/O last 3 completed shifts: In: 2300 [I.V.:2300] Out: -   PHYSICAL EXAMINATION: General:  Morbidly obese female on vent Neuro:  Comatose (no sedation except intubation meds 1.5 hours prior to my evaluation) HEENT:  Lam[place]/AT, PERRL, no appreciable JVD Cardiovascular:  Tachy, regular, no MRG Lungs: Expiratory wheeze Abdomen:  Protuberant, soft Musculoskeletal:  No acute deformity Skin:  Grossly intact  LABS:  BMET  Recent Labs Lab 11-01-16 0512  NA 144  K 4.9  CL 109  CO2 23  BUN 10  CREATININE 1.17*  GLUCOSE 293*    Electrolytes  Recent Labs Lab 01-Nov-2016 0512  CALCIUM 8.9    CBC  Recent Labs Lab Nov 01, 2016 0512  WBC 5.2  HGB 11.0*  HCT 35.5*  PLT 192    Coag's No results for input(s): APTT, INR in the last 168 hours.  Sepsis Markers  Recent Labs Lab 2016-11-01 0521  LATICACIDVEN 7.09*    ABG  Recent Labs Lab Nov 01, 2016 0536  PHART 7.057*  PCO2ART 76.9*  PO2ART 379.0*    Liver Enzymes  Recent Labs Lab Nov 01, 2016 0512  AST 622*  ALT 650*  ALKPHOS 71  BILITOT 0.5  ALBUMIN 2.9*    Cardiac Enzymes No results for input(s): TROPONINI, PROBNP in the last 168 hours.  Glucose No results for input(s): GLUCAP in the last 168 hours.  Imaging Dg Chest Portable 1 View  Result Date: Nov 01, 2016 CLINICAL DATA:  71 y/o F; post CPR, endotracheal tube, orogastric tube. EXAM: PORTABLE CHEST 1 VIEW COMPARISON:  10/29/2015 chest radiograph. FINDINGS: Normal cardiac silhouette given projection and technique. Endotracheal tube is 13 mm from carina. Enteric tube tip extends below the field of view into the abdomen. Interstitial prominence probably represents interstitial edema. No focal consolidation. No effusion or pneumothorax. No acute osseous abnormality is evident. IMPRESSION: Enteric tube tip 13 mm from carina, consider 2-3 cm retraction. Enteric tube tip below the field of  view in the abdomen. Interstitial pulmonary edema. Electronically Signed   By: 10/31/2015 M.D.   On: 11-01-2016 05:28     STUDIES:  CXR 5/8 > mild interstitial edema. CT head 5/8 >  Echo 5/8 >   CULTURES: Blood 5/8 > Sputum 5/8 >  ANTIBIOTICS: Ceftriaxone 5/8 >   SIGNIFICANT EVENTS: 5/8 > admit, started on hypothermia protocol.  LINES/TUBES: ETT 5/8 >  CVL pending 5/8 > A line pending 5/8 >   DISCUSSION: 71 year old female with history of COPD and OSA on CPAP, admitted 5/8 with PEA arrest. She awoke from sleep earlier that morning with profound dyspnea and upon EMS arrival, was found to be in PEA. Suspect CPAP malfunction with resultant hypoxemia leading to arrest (she felt that CPAP had not been given her enough air over the past few days)  ASSESSMENT / PLAN:  PULMONARY A: Acute hypoxemic/hypercarbic respiratory failure - S/P intubation in ED. COPD with acute exacerbation. OSA on CPAP -  reported that CPAP had not been given her enough air over the past few days. P:   Continue full vent support. Assess ABG, wean as able. Hold SBT until TTM complete. Empiric ceftriaxone. Continue steroids, BD's. Follow CXR.  CARDIOVASCULAR A:  Cardiac Arrest PEA - suspect CPAP malfunction with resultant hypoxemia leading to arrest. Cardiogenic shock - echo from February 2018 with EF 55-60%, G1DD. Hx HTN, HLD, CAD. P:  Start TTM, goal 33. Levophed as needed for goal MAP > 80. Trend troponin, lactate. Assess echo. Repeat EKG. Cardiology consult. Continue preadmission ASA. Hold preadmission lisinopril.  RENAL A:   AKI - exacerbated by ACEi. At risk for multiple metabolic derangements during TTM. P:   NS @ 11ml/hr. Correct electrolytes as indicated. BMP every 2 hours  4 then every 4 hours.  GASTROINTESTINAL A:   Transaminitis - ? Shock liver.  Although, had short downtime; therefore less likely. GI prophylaxis. Nutrition. P:   Trend LFTs. SUP:  Pantoprazole. NPO for now. Start tube feeds once TTM complete.  HEMATOLOGIC A:   VTE prophylaxis. P:  SCD's / heparin. Coags q8hrs x 2. CBC and coags in AM.  INFECTIOUS A:   AECOPD. P:   Ceftriaxone x 5 days. Follow cultures.  ENDOCRINE A:   Hx DM. P:   ICU hyperglycemia protocol.  NEUROLOGIC A:   Acute anoxic vs metabolic encephalopathy. Hx anxiety. P:   Sedation: Cisatracurium gtt / Fentanyl gtt / Midazolam gtt. RASS goal: -5 during cooling phase of TTM. Hold WUA until off paralytics. Assess EEG, UDS. F/u on CT head. May need neuro consult depending on clinical course. Hold preadmission alprazolam, norco.   FAMILY  - Updates: Daughter updated at bedside by Joneen Roach.  Full code status per daughter.  - Inter-disciplinary family meet or Palliative Care meeting due by:  5/14.  CC time: 40 min.   Rutherford Guys, [place] [facility] Pager: [phone]  or [phone] 19-Oct-2016, 11:04 AM    ATTENDING NOTE / ATTESTATION NOTE :   I have discussed the case with the resident/APP  Rutherford Guys PA and Joneen Roach NP  I agree with the resident/APP's  history, physical examination, assessment, and plans.    I have edited the above note and modified it according to our agreed history, physical examination, assessment and plan.   Briefly, 71 year old female with PMH  significant for COPD, OSA on CPAP, DM, RA, and CAD. She has been feeling relatively good recently with the exception that she felt her CPAP hadn't been giving her enough air over the last few nights. 5/8 Early AM she awoke from sleep with profound dyspnea which worsened until she became unresponsive. This was witnessed by family who called EMS. First responders arrived and found her unconscious with a pulse; therefore, they performed one round of CPR. Upon EMS arrival she was found to be pulseless with PEA. She underwent one additional round of CPR and ROSC was  achieved. She was transported to the ED where she was emergently intubated. She became hypotensive requiring high dose norepinephrine. PCCM asked to admit.  She remained unresponsive in ED; therefore, hypothermia protocol was initiated.  Her BP subsequently bottomed out despite titrating levophed up.  She had sinus bradycardia then went into arrest.  She ended up having 14 minutes of CPR before she had ROSC.  She got several epi pushes + 2 amps bicarbonate.  She was also shocked for vfib.  She went into cardiac arrest again and was pronounced after 4 more minutes of arrest.  Total arrest time was at least 25 minutes.   Family has been updated of events since they have beein in the ED throughout this time.  In between arrests, pt was not doing anything.  No response despite not being on sedation.    Vitals:  Vitals:   2016-11-14 0811 Nov 14, 2016 0814 2016-11-14 0820 Nov 14, 2016 0856  BP: (!) 78/42 107/74 93/78   Pulse: (!) 124  (!) 30   Resp: (!) 26 19 (!) 30   Temp:      TempSrc:      SpO2:   (!) 79%   Weight:    102.1 kg (225 lb)  Height:        Constitutional/General: well-nourished, well-developed, intubated, sedated, not in any distress  Body mass index is 38.62 kg/m. Wt Readings from Last 3 Encounters:  11-14-16 102.1 kg (225 lb)  09/04/16 86.2 kg (190 lb)  07/04/16 87 kg (191 lb 12.8 oz)    HEENT: pupils were non reactive. anicteric sclerae. (-) Oral thrush. Intubated, ETT in place  Neck: No masses. Midline trachea. No JVD, (-) LAD. (-) bruits appreciated.  Respiratory/Chest: Grossly normal chest. (-) deformity. (-) Accessory muscle use.  Symmetric expansion. Diminished BS on both lower lung zones. (-) wheezing,  Rhonchi Crackles at bases (-) egophony  Cardiovascular: Regular rate and  rhythm, heart sounds normal, no murmur or gallops,  Trace peripheral edema  Gastrointestinal:  Hypoactive bowel sounds. Soft, non-tender. No hepatosplenomegaly.  (-) masses.   Musculoskeletal:   Unable to determine  Extremities: Grossly normal. (-) clubbing, cyanosis.  Trace  Edema. Cool distal extremities  Skin: (-) rash,lesions seen.   Neurological/Psychiatric : sedated, intubated. CN grossly intact. (-) lateralizing signs.     CBC Recent Labs     2016/11/14  0512  WBC  5.2  HGB  11.0*  HCT  35.5*  PLT  192    Coag's No results for input(s): APTT, INR in the last 72 hours.  BMET Recent Labs     11/14/2016  0512  NA  144  K  4.9  CL  109  CO2  23  BUN  10  CREATININE  1.17*  GLUCOSE  293*    Electrolytes Recent Labs     11/14/2016  0512  CALCIUM  8.9    Sepsis Markers No results for input(s): PROCALCITON, O2SATVEN in the last 72 hours.  Invalid input(s): LACTICACIDVEN  ABG Recent Labs     Nov 14, 2016  0536  PHART  7.057*  PCO2ART  76.9*  PO2ART  379.0*    Liver Enzymes Recent Labs     2016-11-14  0512  AST  622*  ALT  650*  ALKPHOS  71  BILITOT  0.5  ALBUMIN  2.9*    Cardiac Enzymes No results for input(s): TROPONINI, PROBNP in the last 72 hours.  Glucose No results for input(s): GLUCAP in the last 72 hours.  Imaging Dg Chest Portable 1 View  Result Date: 2016/11/14 CLINICAL DATA:  71 y/o F; post CPR, endotracheal tube, orogastric tube. EXAM: PORTABLE CHEST 1 VIEW COMPARISON:  10/29/2015 chest radiograph. FINDINGS: Normal cardiac silhouette given projection and technique. Endotracheal tube is 13 mm from carina. Enteric tube tip extends below the field of view into the abdomen. Interstitial prominence probably represents interstitial edema. No focal consolidation. No effusion or pneumothorax. No acute osseous abnormality is evident. IMPRESSION: Enteric tube tip 13 mm from carina,  consider 2-3 cm retraction. Enteric tube tip below the field of view in the abdomen. Interstitial pulmonary edema. Electronically Signed   By: Mitzi Hansen M.D.   On: 2016-11-02 05:28    Assessment: S/P Cardiac arrest, likely from ACS + acute  pulmonary edema. 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  Patient received 3.5 L saline as part of resuscitation.  She was on levophed drip.  She was placed on broad spectrum abx.  She was pancultured. She was on the ventilator.  The plan was to transfer her to ICU but she had 2 episodes of cardiac arrest.  The second arrest ended up being the last as she was subsequently pronounced.  Family around when pt was pronounced.  Pt was pronounced at 8:33am, 11/02/2016   I spent  60   minutes of Critical Care time with this patient today. This is my time spent independent of the APP or resident.   Family :Family updated extensively  at length today.   Pollie Meyer, MD 2016/11/02, 11:04 AM [facility] Pager [phone] After 3 pm or if no answer, call [phone]

## 2016-11-10 DEATH — deceased

## 2016-11-14 ENCOUNTER — Telehealth: Payer: Self-pay

## 2016-11-14 NOTE — Telephone Encounter (Signed)
On 11/14/16 I received a death certificate from [place] with [employer]. The death certificate is one that the original got lost in the mail. The death certificate will be taken to Pulmonary [facility] for signature.  On December 12, 2016 I received the death certificate back from Doctor Dios. I got the death certificate ready and left Alona Bene with [employer] a message that the d/c was ready for pickup.

## 2016-12-19 ENCOUNTER — Ambulatory Visit: Payer: Medicare Other | Admitting: *Deleted

## 2017-02-21 IMAGING — CT CT ABD-PELV W/ CM
2 of 5 series · 16 of 46 positions shown, 18 images · IV contrast (iopamidol)
Comparison: Abdominal radiograph performed 10/29/2015

CLINICAL DATA: Acute onset of lower extremity weakness. Lower back
pain and constipation. Initial encounter.

EXAM:
CT ABDOMEN AND PELVIS WITH CONTRAST
TECHNIQUE: Multidetector CT imaging of the abdomen and pelvis was performed
using the standard protocol following bolus administration of
intravenous contrast.
CONTRAST:  100mL PBT4YZ-ASS IOPAMIDOL (PBT4YZ-ASS) INJECTION 61%

[Series 2: abd/pel with · axial · 0.83mm/px · z∈[-427,-57]mm · 13 of 86 slices shown, 15 images]
[im 6/86  soft-tissue]
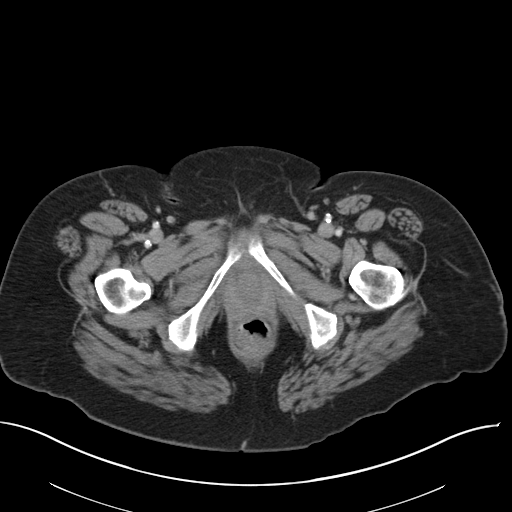
[im 6/86  bone]
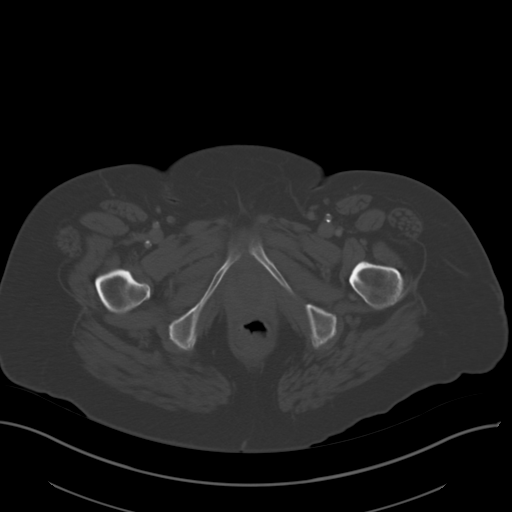
[im 12/86  soft-tissue]
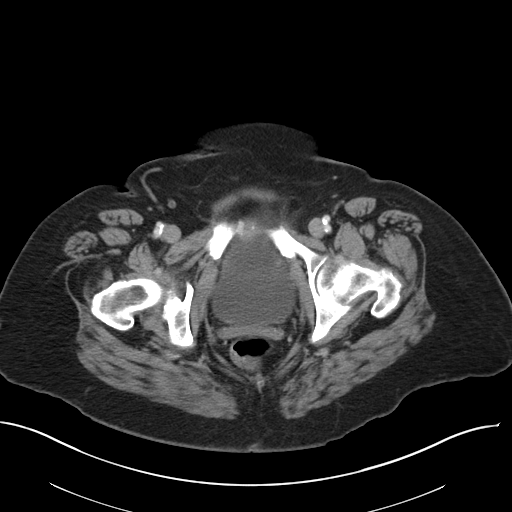
[im 18/86  soft-tissue]
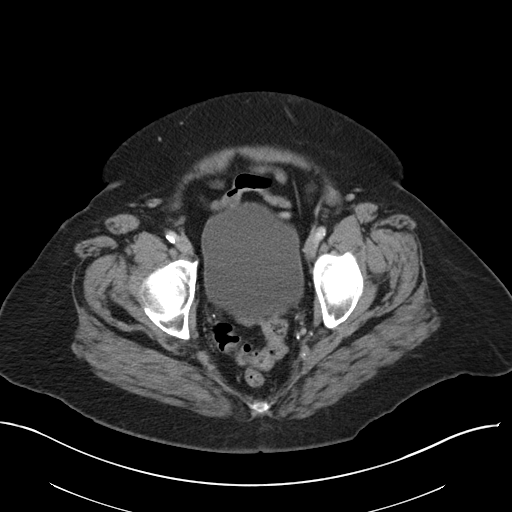
[im 23/86  soft-tissue]
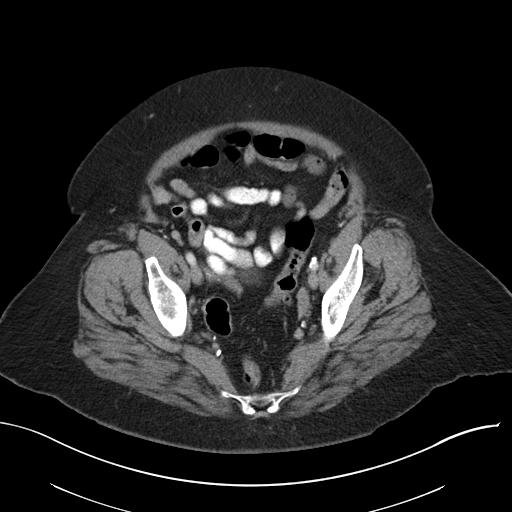
[im 29/86  soft-tissue]
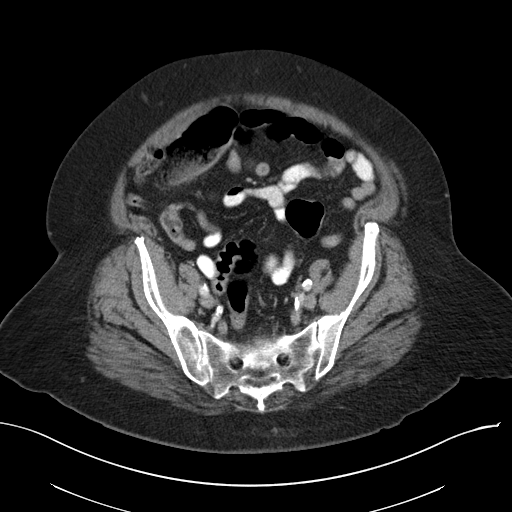
[im 35/86  soft-tissue]
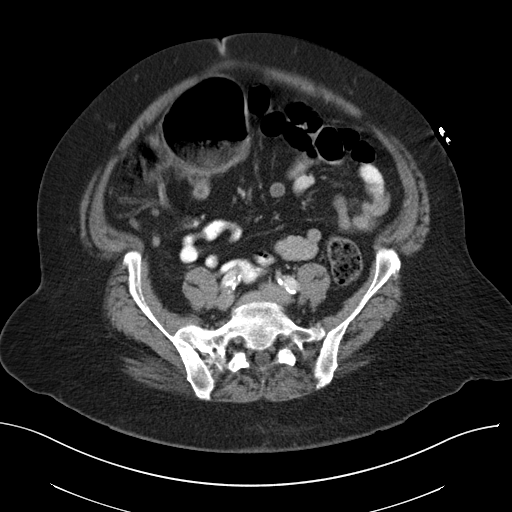
[im 46/86  soft-tissue]
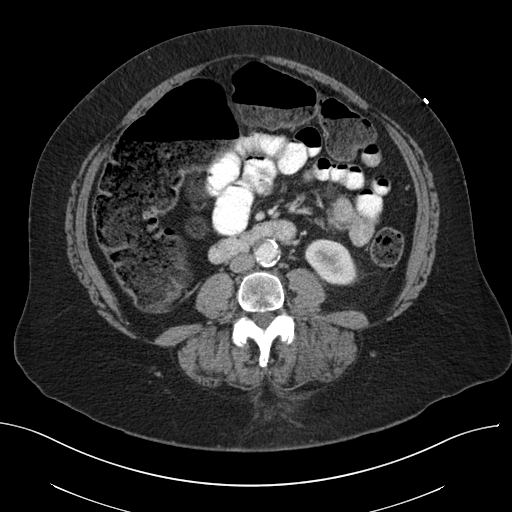
[im 52/86  soft-tissue]
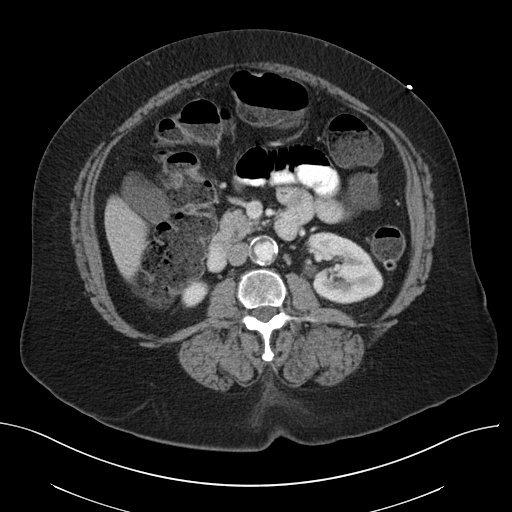
[im 57/86  soft-tissue]
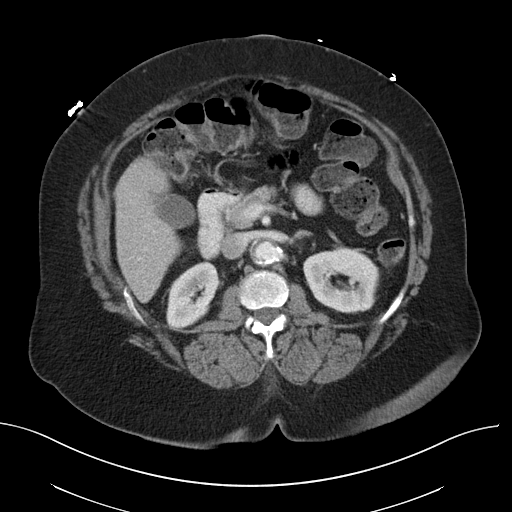
[im 57/86  bone]
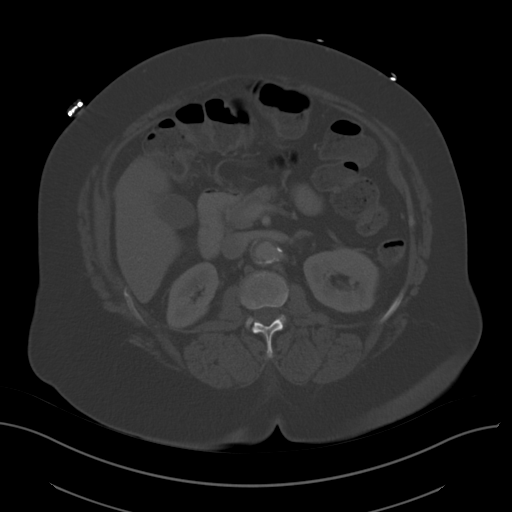
[im 63/86  soft-tissue]
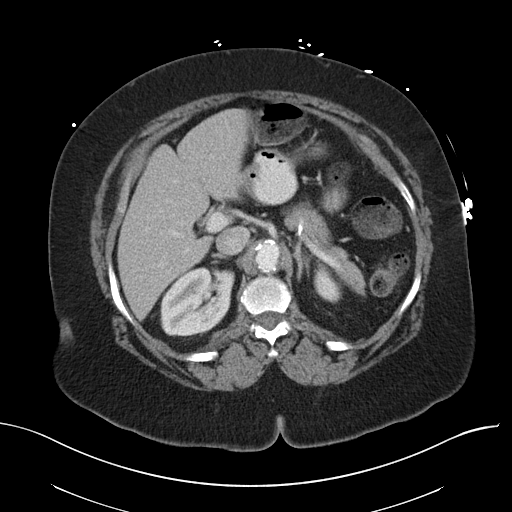
[im 69/86  soft-tissue]
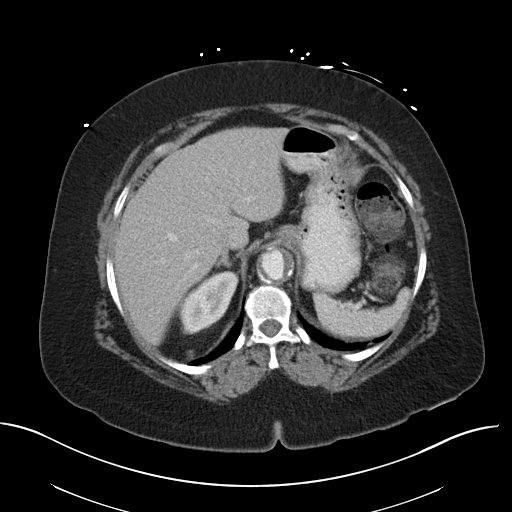
[im 74/86  soft-tissue]
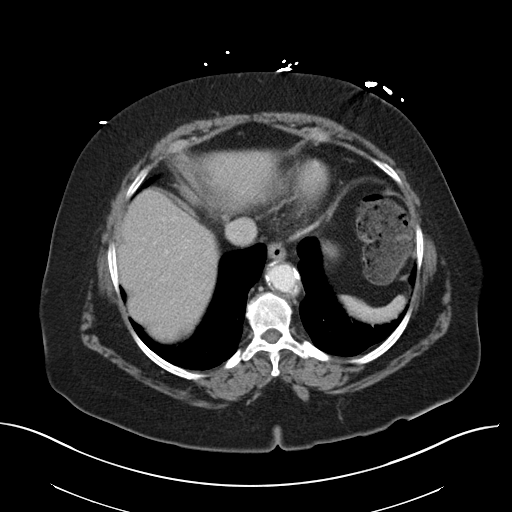
[im 80/86  soft-tissue]
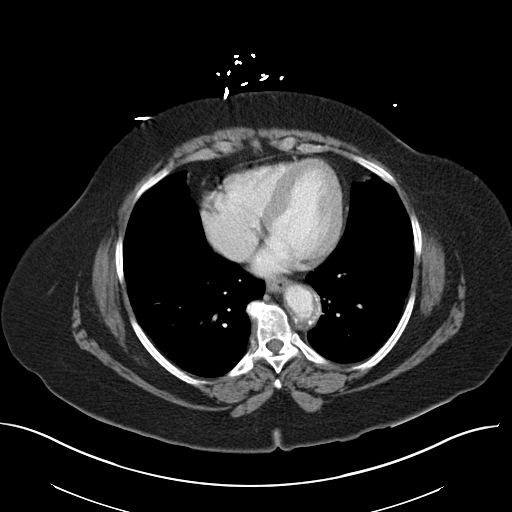

[Series 5: coronal a/|p · coronal · 0.81mm/px · 3 of 200 slices shown]
[im 67/200  soft-tissue]
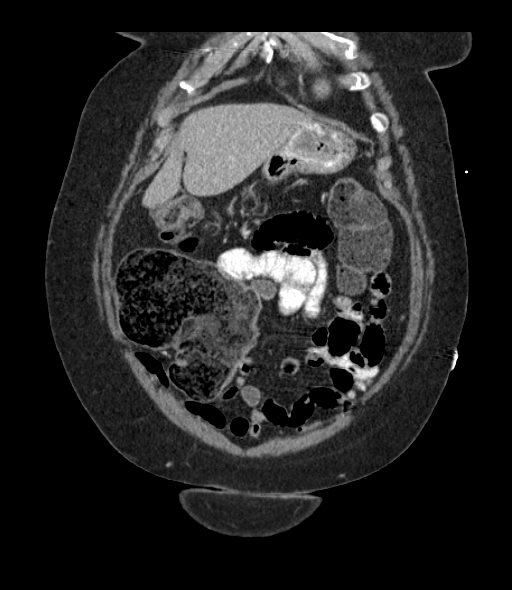
[im 89/200  soft-tissue]
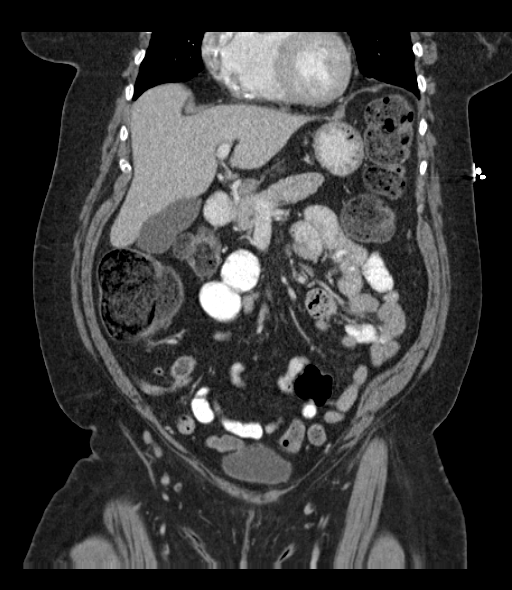
[im 111/200  soft-tissue]
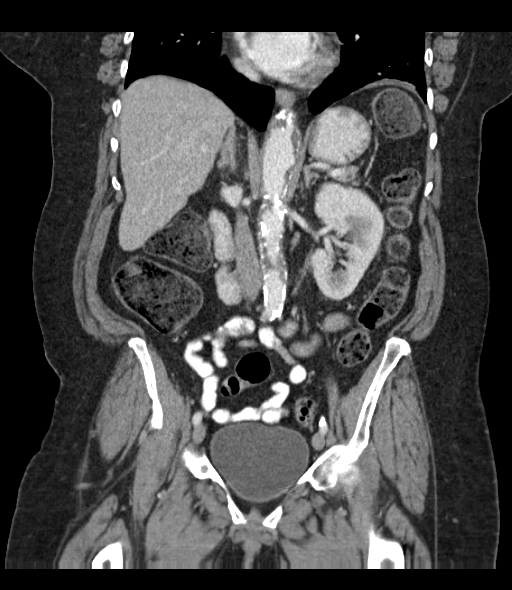

[16 of 46 positions shown; findings below may reference images not displayed]

FINDINGS: The visualized lung bases are clear. Scattered coronary artery
calcifications are seen.

The liver and spleen are unremarkable in appearance. The gallbladder
is within normal limits. The pancreas and adrenal glands are
unremarkable.

The kidneys are unremarkable in appearance. There is no evidence of
hydronephrosis. No renal or ureteral stones are seen. No perinephric
stranding is appreciated.

No free fluid is identified. The small bowel is unremarkable in
appearance. The stomach is within normal limits. No acute vascular
abnormalities are seen. Relatively diffuse calcification is seen
along the abdominal aorta and its branches, with associated mural
thrombus but no significant luminal narrowing.

The appendix is normal in caliber, without evidence of appendicitis.
The colon is largely filled with stool, though the sigmoid colon is
decompressed. Minimal diverticulosis is noted along the descending
and sigmoid colon, without evidence of diverticulitis.

The bladder is mildly distended and grossly unremarkable. The
patient is status post hysterectomy. No suspicious adnexal masses
are seen. The left ovary is unremarkable in appearance. No inguinal
lymphadenopathy is seen.

No acute osseous abnormalities are identified. Facet disease is
noted along the lumbar spine.
IMPRESSION: 1. Large amount of stool noted within the colon, compatible with
constipation. The sigmoid colon is largely decompressed.
2. Relatively diffuse calcification along the abdominal aorta and
its branches, with associated mural thrombus but no significant
luminal narrowing.
3. Scattered coronary artery calcifications seen.
4. Minimal diverticulosis along the descending and sigmoid colon,
without evidence of diverticulitis.

## 2017-03-25 IMAGING — US US ABDOMEN LIMITED
1 series · 14 of 25 positions shown · non-contrast
Comparison: CT of the abdomen and pelvis 11/01/2015

CLINICAL DATA: Several days of right upper quadrant pain.

EXAM:
US ABDOMEN LIMITED - RIGHT UPPER QUADRANT

[Series 1: us abdomen limited · 0.22mm/px · 14 of 34 slices shown]
[im 1/34]
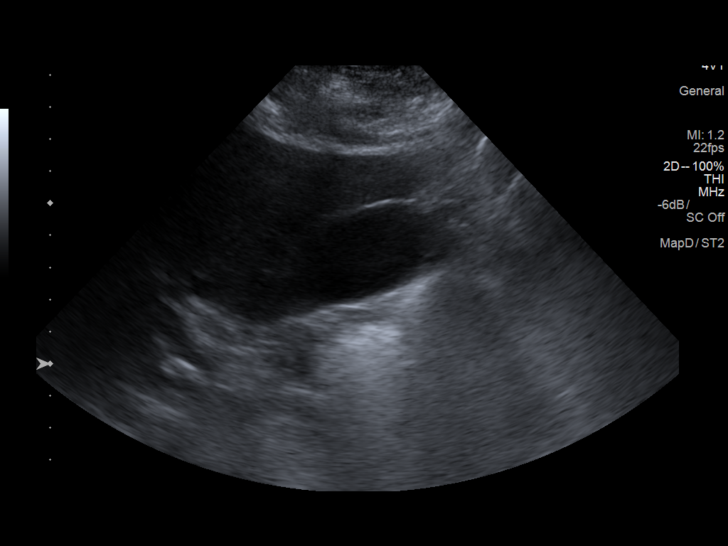
[im 3/34]
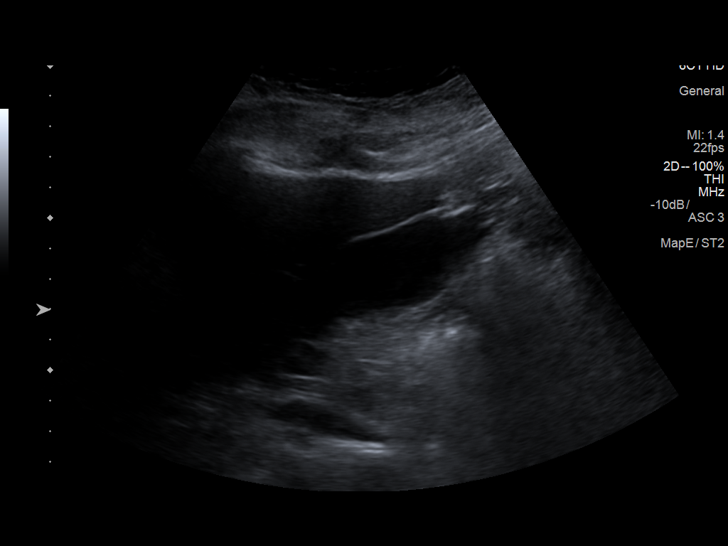
[im 6/34]
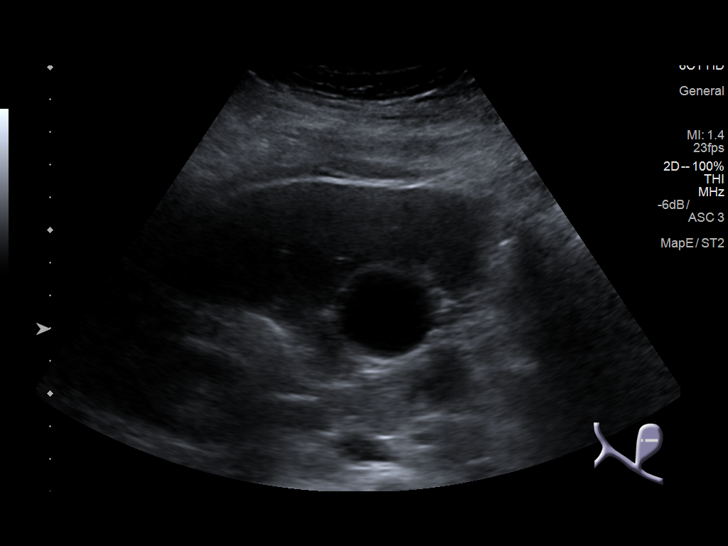
[im 9/34]
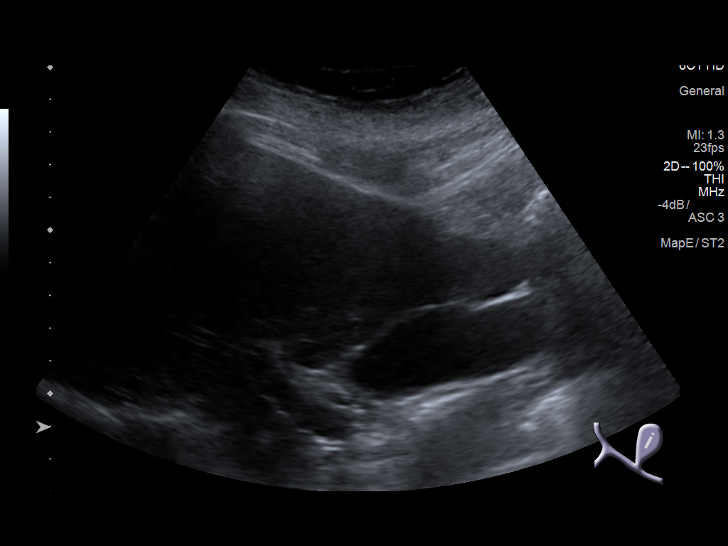
[im 12/34]
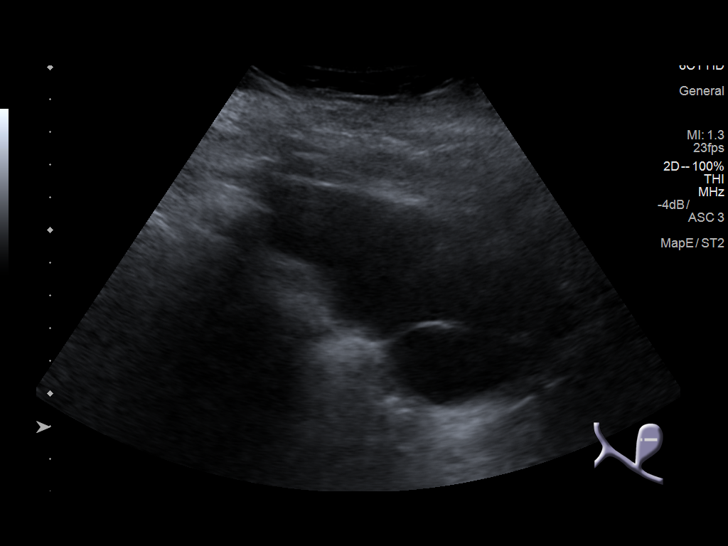
[im 13/34]
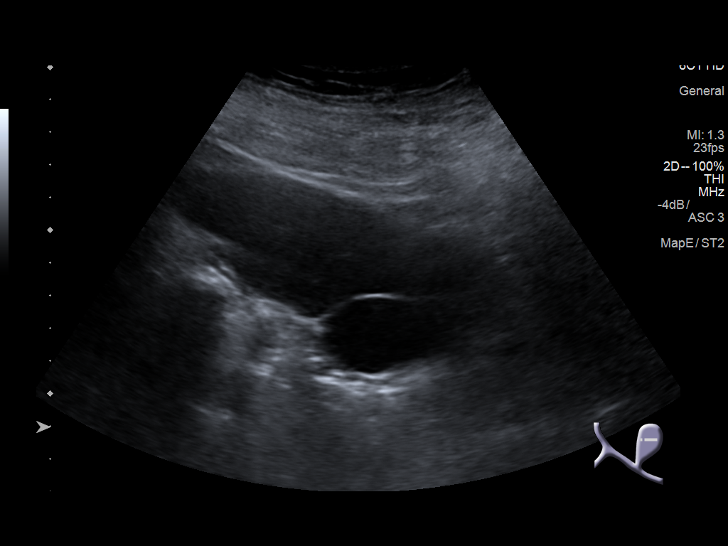
[im 16/34]
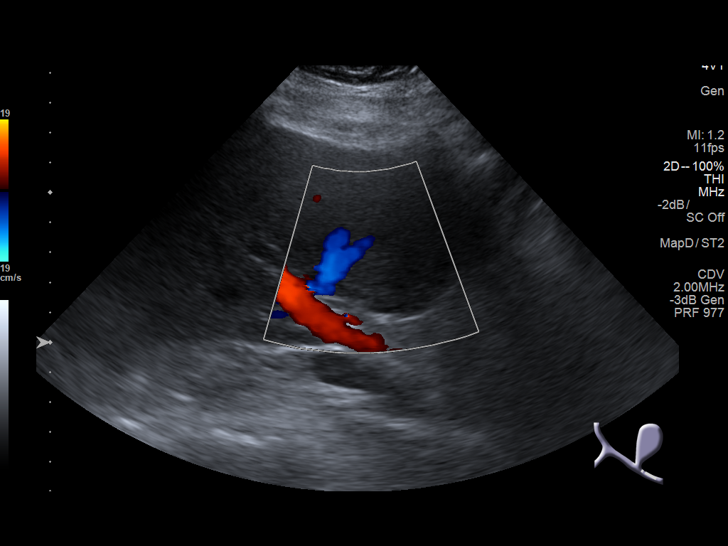
[im 18/34]
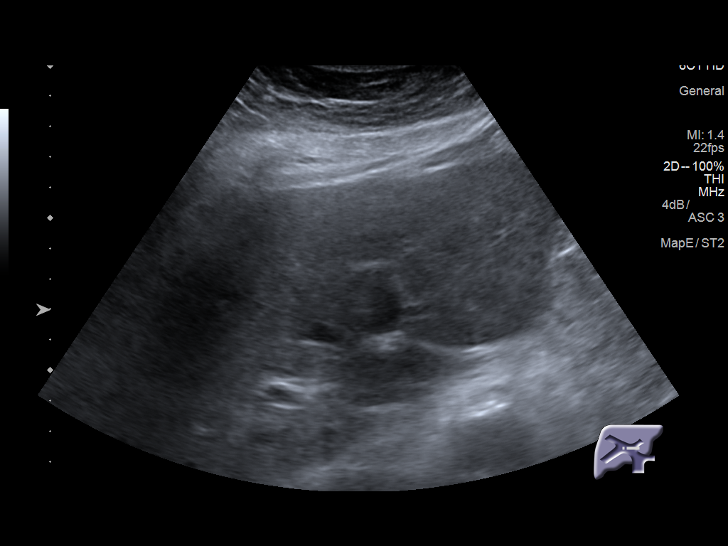
[im 21/34]
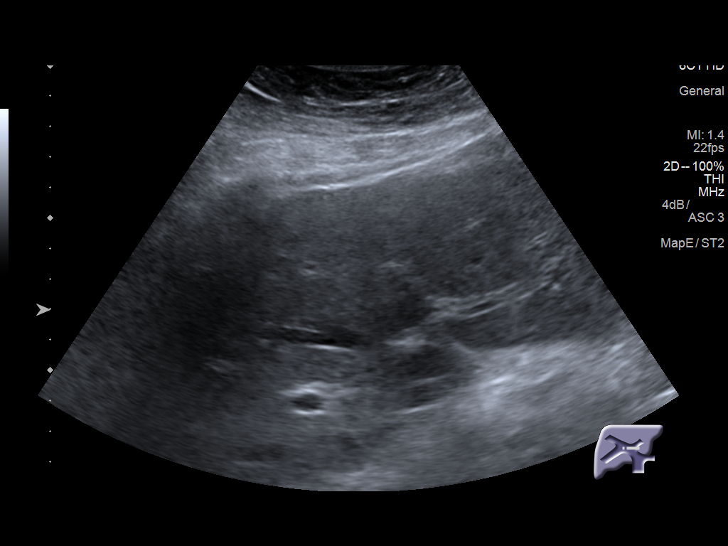
[im 23/34]
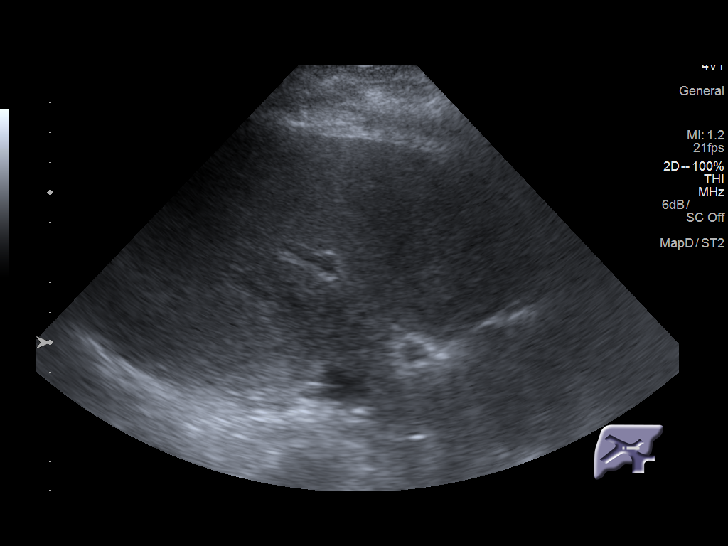
[im 25/34]
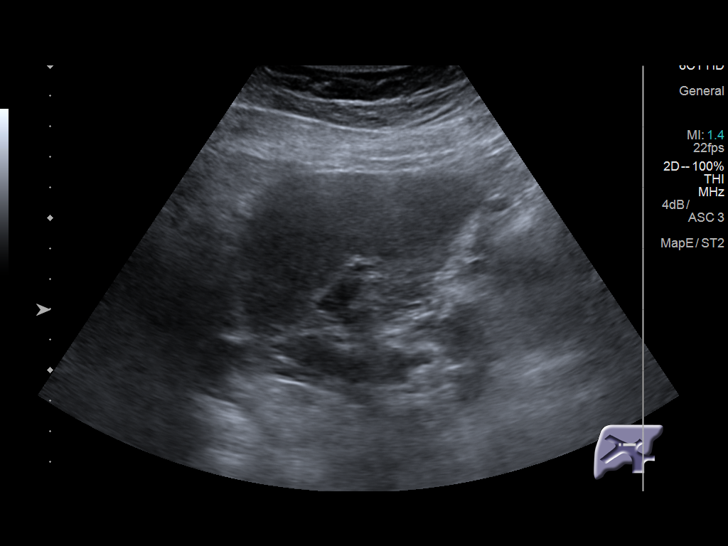
[im 28/34]
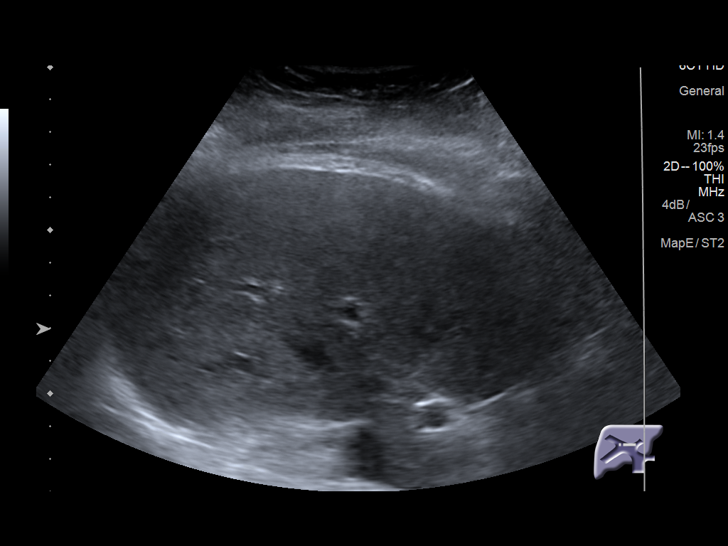
[im 31/34]
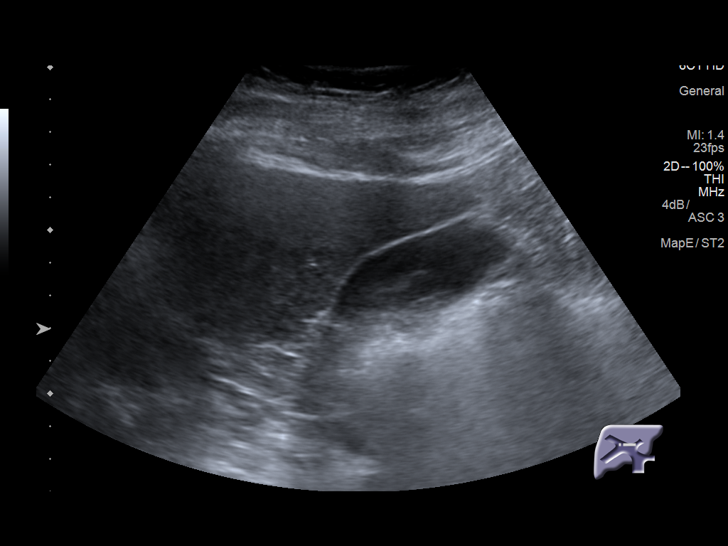
[im 34/34]
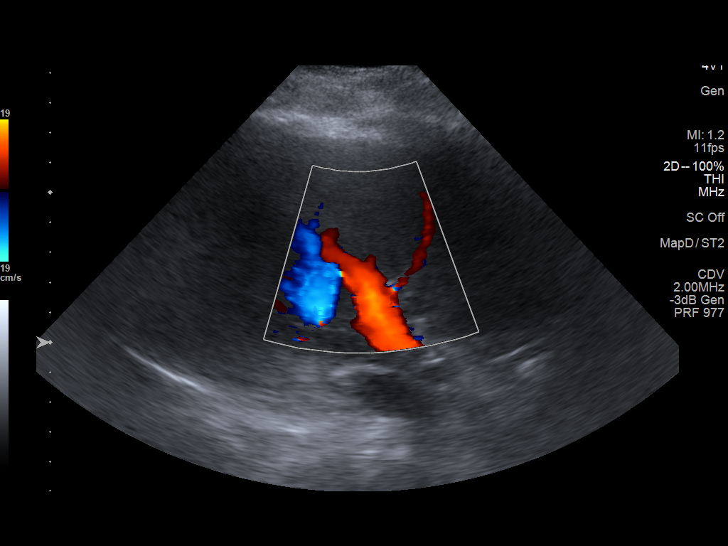

[14 of 25 positions shown; findings below may reference images not displayed]

FINDINGS: Gallbladder:

No gallstones or wall thickening visualized. No sonographic Murphy
sign noted by sonographer.

Common bile duct:

Diameter: 4.9 mm.

Liver:

No focal lesion identified. Within normal limits in parenchymal
echogenicity.
IMPRESSION: Normal right upper quadrant ultrasound, given the technical
limitations of this exam due to patient's body habitus.
